# Patient Record
Sex: Male | Born: 1985 | Race: White | Hispanic: No | Marital: Single | Smoking: Former smoker
Health system: Southern US, Community
[De-identification: ages and names within clinical notes are randomized; demographics above are authoritative.]

## PROBLEM LIST (undated history)

## (undated) DIAGNOSIS — R748 Abnormal levels of other serum enzymes: Secondary | ICD-10-CM

## (undated) DIAGNOSIS — I10 Essential (primary) hypertension: Secondary | ICD-10-CM

## (undated) DIAGNOSIS — F4310 Post-traumatic stress disorder, unspecified: Secondary | ICD-10-CM

## (undated) DIAGNOSIS — F32A Depression, unspecified: Secondary | ICD-10-CM

## (undated) DIAGNOSIS — F1020 Alcohol dependence, uncomplicated: Secondary | ICD-10-CM

## (undated) DIAGNOSIS — K760 Fatty (change of) liver, not elsewhere classified: Secondary | ICD-10-CM

## (undated) DIAGNOSIS — F319 Bipolar disorder, unspecified: Secondary | ICD-10-CM

## (undated) DIAGNOSIS — E785 Hyperlipidemia, unspecified: Secondary | ICD-10-CM

## (undated) DIAGNOSIS — F419 Anxiety disorder, unspecified: Secondary | ICD-10-CM

## (undated) HISTORY — DX: Fatty (change of) liver, not elsewhere classified: K76.0

## (undated) HISTORY — DX: Hyperlipidemia, unspecified: E78.5

## (undated) HISTORY — DX: Anxiety disorder, unspecified: F41.9

## (undated) HISTORY — DX: Depression, unspecified: F32.A

## (undated) HISTORY — DX: Bipolar disorder, unspecified: F31.9

## (undated) HISTORY — DX: Essential (primary) hypertension: I10

## (undated) HISTORY — DX: Post-traumatic stress disorder, unspecified: F43.10

## (undated) HISTORY — DX: Abnormal levels of other serum enzymes: R74.8

## (undated) HISTORY — DX: Alcohol dependence, uncomplicated: F10.20

---

## 2003-03-19 ENCOUNTER — Emergency Department (HOSPITAL_COMMUNITY): Admission: EM | Admit: 2003-03-19 | Discharge: 2003-03-20 | Payer: Self-pay | Admitting: Emergency Medicine

## 2003-03-20 ENCOUNTER — Encounter: Payer: Self-pay | Admitting: Emergency Medicine

## 2005-10-24 ENCOUNTER — Encounter: Admission: RE | Admit: 2005-10-24 | Discharge: 2005-10-24 | Payer: Self-pay | Admitting: Family Medicine

## 2014-05-22 ENCOUNTER — Emergency Department (HOSPITAL_COMMUNITY)
Admission: EM | Admit: 2014-05-22 | Discharge: 2014-05-23 | Disposition: A | Discharge status: Home / Self Care | Payer: Self-pay | Attending: Emergency Medicine | Admitting: Emergency Medicine

## 2014-05-22 ENCOUNTER — Emergency Department (HOSPITAL_COMMUNITY): Payer: Self-pay

## 2014-05-22 ENCOUNTER — Encounter (HOSPITAL_COMMUNITY): Payer: Self-pay | Admitting: Emergency Medicine

## 2014-05-22 DIAGNOSIS — R1013 Epigastric pain: Secondary | ICD-10-CM | POA: Insufficient documentation

## 2014-05-22 DIAGNOSIS — Z720 Tobacco use: Secondary | ICD-10-CM | POA: Insufficient documentation

## 2014-05-22 LAB — COMPREHENSIVE METABOLIC PANEL
ALT: 77 U/L — ABNORMAL HIGH (ref 0–53)
AST: 47 U/L — ABNORMAL HIGH (ref 0–37)
Albumin: 4.2 g/dL (ref 3.5–5.2)
Alkaline Phosphatase: 94 U/L (ref 39–117)
Anion gap: 12 (ref 5–15)
BUN: 15 mg/dL (ref 6–23)
CALCIUM: 9.9 mg/dL (ref 8.4–10.5)
CHLORIDE: 99 meq/L (ref 96–112)
CO2: 23 mEq/L (ref 19–32)
CREATININE: 1.01 mg/dL (ref 0.50–1.35)
GFR calc Af Amer: >90 mL/min (ref >90)
GLUCOSE: 99 mg/dL (ref 70–99)
Potassium: 3.9 mEq/L (ref 3.7–5.3)
SODIUM: 134 meq/L — AB (ref 137–147)
Total Bilirubin: 0.3 mg/dL (ref 0.3–1.2)
Total Protein: 7.9 g/dL (ref 6.0–8.3)

## 2014-05-22 LAB — CBC WITH DIFFERENTIAL/PLATELET
BASOS ABS: 0 10*3/uL (ref 0.0–0.1)
BASOS PCT: 0 % (ref 0–1)
EOS ABS: 0.1 10*3/uL (ref 0.0–0.7)
Eosinophils Relative: 1 % (ref 0–5)
HEMATOCRIT: 46.9 % (ref 39.0–52.0)
HEMOGLOBIN: 16.4 g/dL (ref 13.0–17.0)
LYMPHS PCT: 21 % (ref 12–46)
Lymphs Abs: 2.2 10*3/uL (ref 0.7–4.0)
MCH: 31.8 pg (ref 26.0–34.0)
MCHC: 35 g/dL (ref 30.0–36.0)
MCV: 90.9 fL (ref 78.0–100.0)
Monocytes Absolute: 1 10*3/uL (ref 0.1–1.0)
Monocytes Relative: 10 % (ref 3–12)
NEUTROS ABS: 6.8 10*3/uL (ref 1.7–7.7)
Neutrophils Relative %: 68 % (ref 43–77)
Platelets: 204 10*3/uL (ref 150–400)
RBC: 5.16 MIL/uL (ref 4.22–5.81)
RDW: 13.3 % (ref 11.5–15.5)
WBC: 10.2 10*3/uL (ref 4.0–10.5)

## 2014-05-22 LAB — URINALYSIS, ROUTINE W REFLEX MICROSCOPIC
Bilirubin Urine: NEGATIVE
Glucose, UA: NEGATIVE mg/dL
HGB URINE DIPSTICK: NEGATIVE
Ketones, ur: NEGATIVE mg/dL
Leukocytes, UA: NEGATIVE
NITRITE: NEGATIVE
PH: 6.5 (ref 5.0–8.0)
PROTEIN: NEGATIVE mg/dL
SPECIFIC GRAVITY, URINE: 1.012 (ref 1.005–1.030)
Urobilinogen, UA: 1 mg/dL (ref 0.0–1.0)

## 2014-05-22 LAB — LIPASE, BLOOD: LIPASE: 43 U/L (ref 11–59)

## 2014-05-22 MED ORDER — PANTOPRAZOLE SODIUM 40 MG IV SOLR
40.0000 mg | Freq: Once | INTRAVENOUS | Status: AC
  Administered 2014-05-22: 40 mg via INTRAVENOUS
  Filled 2014-05-22: qty 40

## 2014-05-22 MED ORDER — GI COCKTAIL ~~LOC~~
30.0000 mL | Freq: Once | ORAL | Status: AC
  Administered 2014-05-22: 30 mL via ORAL
  Filled 2014-05-22: qty 30

## 2014-05-22 NOTE — ED Notes (Signed)
Pt complains of epigastric abd pain for 5 days, he states that it burns and cramps. No vomiting or diarrhea, but states that he has frequent painful urination.

## 2014-05-22 NOTE — ED Notes (Signed)
Bed: [facility]PT4 Expected date:  Expected time:  Means of arrival:  Comments: EMS 28 yo male ambulatory with abdominal pain

## 2014-05-22 NOTE — ED Provider Notes (Signed)
CSN: [id number]     Arrival date & time 05/22/14  2142 History   First MD Initiated Contact with Patient 05/22/14 2248     Chief Complaint  Patient presents with  . Abdominal Pain     (Consider location/radiation/quality/duration/timing/severity/associated sxs/prior Treatment) HPI Comments: Patient presents today with a chief complaint of epigastric abdominal pain.  He reports that the pain has been occurring intermittently over the past week.  He states that the pain is brought on by eating.  He describes the pain as a burning pain and reports that it radiates into his chest.  He has not taken anything for the pain.  He denies nausea, vomiting, diarrhea, or constipation.  He reports a history of Alcoholism.  He states that he had been drinking heavily up until one week ago.  He denies hematemesis.  No history of esophageal varices, GERD, or PUD.  No history of Gallstones.  He also reports mild SOB and cough.  No chest pain at this time.  Denies fever or chills.    Patient is a 28 y.o. male presenting with abdominal pain. The history is provided by the patient.  Abdominal Pain   History reviewed. No pertinent past medical history. History reviewed. No pertinent past surgical history. History reviewed. No pertinent family history. History  Substance Use Topics  . Smoking status: Current Every Day Smoker  . Smokeless tobacco: Not on file  . Alcohol Use: Yes    Review of Systems  Gastrointestinal: Positive for abdominal pain.  All other systems reviewed and are negative.     Allergies  Review of patient's allergies indicates no known allergies.  Home Medications   Prior to Admission medications   Medication Sig Start Date End Date Taking? Authorizing Provider  Aspirin-Salicylamide-Caffeine (BC HEADACHE POWDER PO) Take 1 Package by mouth daily as needed (for pain).   Yes Historical Provider, MD  bismuth subsalicylate (PEPTO BISMOL) 262 MG/15ML suspension Take 15 mLs by mouth  every 6 (six) hours as needed (stomach pain).   Yes Historical Provider, MD   BP 134/88  Pulse 77  Temp(Src) 98.2 F (36.8 C) (Oral)  Resp 14  SpO2 100% Physical Exam  Nursing note and vitals reviewed. Constitutional: He appears well-developed and well-nourished.  HENT:  Head: Normocephalic and atraumatic.  Mouth/Throat: Oropharynx is clear and moist.  Neck: Normal range of motion. Neck supple.  Cardiovascular: Normal rate, regular rhythm and normal heart sounds.   Pulmonary/Chest: Effort normal and breath sounds normal.  Abdominal: Soft. Bowel sounds are normal. He exhibits no distension and no mass. There is no tenderness. There is no rebound, no guarding and negative Murphy's sign.  Musculoskeletal: Normal range of motion.  Neurological: He is alert.  Skin: Skin is warm and dry.  Psychiatric: He has a normal mood and affect.    ED Course  Procedures (including critical care time) Labs Review Labs Reviewed  COMPREHENSIVE METABOLIC PANEL - Abnormal; Notable for the following:    Sodium 134 (*)    AST 47 (*)    ALT 77 (*)    All other components within normal limits  CBC WITH DIFFERENTIAL  LIPASE, BLOOD  URINALYSIS, ROUTINE W REFLEX MICROSCOPIC    Imaging Review Dg Chest 2 View  05/23/2014   CLINICAL DATA:  Initial encounter for chest pain, burning, and cramping for 5 days. Shortness of breath.  EXAM: CHEST  2 VIEW  COMPARISON:  Two-view chest 10/24/2005.  FINDINGS: The heart size and mediastinal contours are within normal limits.  Both lungs are clear. The visualized skeletal structures are unremarkable.  IMPRESSION: Negative two view chest.   Electronically Signed   By: Gennette Pac M.D.   On: 05/23/2014 01:44     EKG Interpretation None     1:19 AM Reassessed patient.  He reports that his pain has improved at this time.  Abdomen soft and non tender. MDM   Final diagnoses:  None   Patient presenting with an intermittent burning epigastric pain radiating to his  chest that occurs after eating.  Patient afebrile.  Labs unremarkable.  Symptoms improved after given GI cocktail and Protonix.  Abdomen is non tender on exam.  Therefore, do not feel that any abdominal imaging is indicated at this time.  Patient stable for discharge.  Patient instructed to take PPI.  Given referral to GI is symptoms persist.  Stable for discharge.  Return precautions given.    Santiago Glad, PA-C 05/24/14 [phone]

## 2014-05-23 MED ORDER — OMEPRAZOLE 20 MG PO CPDR: 20.0000 mg | DELAYED_RELEASE_CAPSULE | Freq: Every day | ORAL | Status: DC

## 2014-05-25 NOTE — ED Provider Notes (Signed)
Medical screening examination/treatment/procedure(s) were performed by non-physician practitioner and as supervising physician I was immediately available for consultation/collaboration.   EKG Interpretation None        Layla MawKristen N Ward, DO 05/25/14 [phone]

## 2021-03-15 ENCOUNTER — Ambulatory Visit (HOSPITAL_COMMUNITY)
Admission: EM | Admit: 2021-03-15 | Discharge: 2021-03-15 | Disposition: A | Discharge status: Home / Self Care | Payer: BC Managed Care – PPO | Attending: Psychiatry | Admitting: Psychiatry

## 2021-03-15 ENCOUNTER — Other Ambulatory Visit: Payer: Self-pay

## 2021-03-15 DIAGNOSIS — F419 Anxiety disorder, unspecified: Secondary | ICD-10-CM | POA: Diagnosis not present

## 2021-03-15 DIAGNOSIS — F4323 Adjustment disorder with mixed anxiety and depressed mood: Secondary | ICD-10-CM | POA: Diagnosis not present

## 2021-03-15 NOTE — BH Assessment (Signed)
Comprehensive Clinical Assessment (CCA) Note  03/15/2021 Francisco Edwards [id number]  Patient is a 35 year old male presenting voluntarily to [facility] for evaluation of increased depression and passive SI. Patient states he has become increasingly depressed since his mother passed away in Dec 24, 2019. He reported his suicidal ideation to his employer who recommended he come here for evaluation. Patient endorses thoughts of "I just don't want to be here anymore." He denies any intent or plan. He states, "Im involved in the church and I know where I'll go if I do that." Patient denies HI/AVH. Other than his mother's passing, he cites work and financial stresses as major triggers. He does report a history of alcohol abuse and states he was sober for 7 years until his mom passed. He states he does not drink excessively and reports drinking about 5 beers 3-4 days per week. He denies using any other substances. Patient states his employer's HR department got him linked with a therapist for 5 sessions but he feels he may need more than his. His PCP prescribes Valium and Welbutrin but he has not seen a psychiatrist.   Per Dr. Bronwen Betters, patient does not meet in patient care criteria and is psych cleared. He has been provided with additional resources and a referral to [facility] PHP.  Chief Complaint: depression  Visit Diagnosis: F33.2 MDD, recurrent, severe    CCA Screening, Triage and Referral (STR)  Patient Reported Information How did you hear about [place]? Self  What Is the Reason for Your Visit/Call Today? Patient presents at recommendation of his supervisor after informing him and other co workers he was having suicidal thoughts.  Patient lost his mother in 05-08-21and father now has cancer.  Patient with hx of anxiety and depression and prescribed wellbutrin and diazepine by PCP.  He states he isn't sure it's working.  SI with thoughts to drive his car into water.  Patient denies HI, AVH.  ETOH use increased to 5  beers per day since mother died. He would like to speak with a provider about medication options and would like referrals for psychiatry.  How Long Has This Been Causing You Problems? > than 6 months  What Do You Feel Would Help You the Most Today? Treatment for Depression or other mood problem   Have You Recently Had Any Thoughts About Hurting Yourself? Yes  Are You Planning to Commit Suicide/Harm Yourself At This time? No   Have you Recently Had Thoughts About Hurting Someone Francisco Edwards? No  Are You Planning to Harm Someone at This Time? No  Explanation: No data recorded  Have You Used Any Alcohol or Drugs in the Past 24 Hours? Yes  How Long Ago Did You Use Drugs or Alcohol? No data recorded What Did You Use and How Much? ETOH - daily use - 5 beers per day   Do You Currently Have a Therapist/Psychiatrist? Yes  Name of Therapist/Psychiatrist: patient's company has linked him with a therapist   Have You Been Recently Discharged From Any Office Practice or Programs? No  Explanation of Discharge From Practice/Program: No data recorded    CCA Screening Triage Referral Assessment Type of Contact: Face-to-Face  Telemedicine Service Delivery:   Is this Initial or Reassessment? No data recorded Date Telepsych consult ordered in [facility]:  No data recorded Time Telepsych consult ordered in [facility]:  No data recorded Location of Assessment: [facility] Assessment Services  Provider Location: [facility] Assessment Services   Collateral Involvement: NA  Does Patient Have a Automotive engineerCourt Appointed Legal Guardian? No data recorded Name and Contact of Legal Guardian: No data recorded If Minor and Not Living with Parent(s), Who has Custody? No data recorded Is CPS involved or ever been involved? Never  Is APS involved or ever been involved? Never   Patient Determined To Be At Risk for Harm To Self or Others Based on Review of Patient Reported Information or Presenting Complaint? No  Method: No data  recorded Availability of Means: No data recorded Intent: No data recorded Notification Required: No data recorded Additional Information for Danger to Others Potential: No data recorded Additional Comments for Danger to Others Potential: No data recorded Are There Guns or Other Weapons in Your Home? No data recorded Types of Guns/Weapons: No data recorded Are These Weapons Safely Secured?                            No data recorded Who Could Verify You Are Able To Have These Secured: No data recorded Do You Have any Outstanding Charges, Pending Court Dates, Parole/Probation? No data recorded Contacted To Inform of Risk of Harm To Self or Others: No data recorded   Does Patient Present under Involuntary Commitment? No  IVC Papers Initial File Date: No data recorded  [place]   Patient Currently Receiving the Following Services: Individual Therapy   Determination of Need: Urgent (48 hours)   Options For Referral: [facility]; Intensive Outpatient Therapy; Medication Management; Outpatient Therapy     CCA Biopsychosocial Patient Reported Schizophrenia/Schizoaffective Diagnosis in Past: No   Strengths: motivated for treatment   Mental Health Symptoms Depression:   Change in energy/activity; Difficulty Concentrating; Fatigue; Hopelessness; Weight gain/loss; Tearfulness; Sleep (too much or little); Irritability; Increase/decrease in appetite; Worthlessness   Duration of Depressive symptoms:  Duration of Depressive Symptoms: Greater than two weeks   Mania:   None   Anxiety:    Difficulty concentrating; Fatigue; Irritability; Restlessness; Sleep; Tension; Worrying   Psychosis:   None   Duration of Psychotic symptoms:    Trauma:   None   Obsessions:   None   Compulsions:   None   Inattention:   None   Hyperactivity/Impulsivity:   None   Oppositional/Defiant Behaviors:   None   Emotional Irregularity:    None   Other Mood/Personality Symptoms:  No data recorded   Mental Status Exam Appearance and self-care  Stature:   Average   Weight:   Average weight   Clothing:   Neat/clean   Grooming:   Normal   Cosmetic use:   None   Posture/gait:   Normal   Motor activity:   Not Remarkable   Sensorium  Attention:   Normal   Concentration:   Normal   Orientation:   X5   Recall/memory:   Normal   Affect and Mood  Affect:   Appropriate; Depressed   Mood:   Depressed   Relating  Eye contact:   Normal   Facial expression:   Responsive   Attitude toward examiner:   Cooperative   Thought and Language  Speech flow:  Clear and Coherent   Thought content:   Appropriate to Mood and Circumstances   Preoccupation:   None   Hallucinations:   None   Organization:  No data recorded  Affiliated Computer ServicesExecutive Functions  Fund of Knowledge:   Good   Intelligence:   Average   Abstraction:   Normal  Judgement:   Fair   Dance movement psychotherapist:   Realistic   Insight:   Fair   Decision Making:   Normal   Social Functioning  Social Maturity:   Isolates   Social Judgement:   Normal   Stress  Stressors:   Grief/losses; Work; Office manager Ability:   Human resources officer Deficits:   None   Supports:   Friends/Service system; Family     Religion: Religion/Spirituality Are You A Religious Person?: Yes What is Your Religious Affiliation?: Christian How Might This Affect Treatment?: statesw, "If I kill myself I know where I'll go."  Leisure/Recreation: Leisure / Recreation Do You Have Hobbies?: Yes Leisure and Hobbies: music  Exercise/Diet: Exercise/Diet Do You Exercise?: No Have You Gained or Lost A Significant Amount of Weight in the Past Six Months?: Yes-Lost Number of Pounds Lost?: 30 Do You Follow a Special Diet?: No Do You Have Any Trouble Sleeping?: Yes   CCA Employment/Education Employment/Work Situation: Employment / Work  Situation Employment Situation: Employed Work Stressors: states he does not get a long with one employee Patient's Job has Been Impacted by Current Illness: No Has Patient ever Been in the [employer]?: No  Education: Education Is Patient Currently Attending School?: No Last Grade Completed: 12 Did You Product manager?: No Did You Have An Individualized Education Program (IIEP): No Did You Have Any Difficulty At [employer]?: No Patient's Education Has Been Impacted by Current Illness: No   CCA Family/Childhood History Family and Relationship History: Family history Marital status: Single Does patient have children?: No  Childhood History:  Childhood History By whom was/is the patient raised?: Both parents Did patient suffer any verbal/emotional/physical/sexual abuse as a child?: No Did patient suffer from severe childhood neglect?: No Has patient ever been sexually abused/assaulted/raped as an adolescent or adult?: No Was the patient ever a victim of a crime or a disaster?: No Witnessed domestic violence?: No Has patient been affected by domestic violence as an adult?: No  Child/Adolescent Assessment:     CCA Substance Use Alcohol/Drug Use: Alcohol / Drug Use Pain Medications: see MAR Prescriptions: see MAR Over the Counter: see MAR History of alcohol / drug use?: Yes Longest period of sobriety (when/how long): 7 years Substance #1 Name of Substance 1: alcohol 1 - Age of First Use: UTA 1 - Amount (size/oz): 5 beers 1 - Frequency: 3-4 times weekly 1 - Duration: 1.5 years 1 - Last Use / Amount: 7/28- 5 beers 1 - Method of Aquiring: purchased 1- Route of Use: drank                       ASAM's:  Six Dimensions of Multidimensional Assessment  Dimension 1:  Acute Intoxication and/or Withdrawal Potential:   Dimension 1:  Description of individual's past and current experiences of substance use and withdrawal: patient not currently in active addiction  Dimension  2:  Biomedical Conditions and Complications:      Dimension 3:  Emotional, Behavioral, or Cognitive Conditions and Complications:     Dimension 4:  Readiness to Change:     Dimension 5:  Relapse, Continued use, or Continued Problem Potential:     Dimension 6:  Recovery/Living Environment:     ASAM Severity Score:    ASAM Recommended Level of Treatment:     Substance use Disorder (SUD)    Recommendations for Services/Supports/Treatments:    Discharge Disposition:    DSM5 Diagnoses: There are no problems to display for this patient.  Referrals to Alternative Service(s): Referred to Alternative Service(s):   Place:   Date:   Time:    Referred to Alternative Service(s):   Place:   Date:   Time:    Referred to Alternative Service(s):   Place:   Date:   Time:    Referred to Alternative Service(s):   Place:   Date:   Time:     Celedonio Miyamoto, LCSW

## 2021-03-15 NOTE — ED Provider Notes (Signed)
Behavioral Health Urgent Care Medical Screening Exam  Patient Name: Francisco Edwards MRN: [id number] Date of Evaluation: 03/15/21 Chief Complaint:   Diagnosis:  Final diagnoses:  Adjustment disorder with mixed anxiety and depressed mood  Anxiety    History of Present illness: Francisco Edwards is a 35 y.o. male with a reported psychiatric history of anxiety and PTSD who presents to the [facility] for evaluation of SI in the setting of numerous stressors; patient recommended to present for evaluation by his supervisor. Pt is interviewed in conjunction with TTS. Pt states that he presented to the [facility] today because "my work is concerned about me". He reports that he has been experiencing passive SI since his motoher passed away in April 2021-states that she passed away suddenly from a stroke. He states that he was the one who had to make the decision to take his mother off of life support and reports that one of his doctors told him he had PTSD from this. Pt says that he recently has gotten back into contact with his father and he goes to his house to cut the grass every weekend- but  pt reports that his father  has also been recently diagnosed with throat cancer. He states that he is still dealing with his mother's estate which has been a source of stress for him and describes financial difficulties. Additional stressors include conflict at this work place -says that one coworker had been bullying him-he states that this was reported to his supervisor and that this individual was reprimanded. He reports some passive SI but denies plan or intent. He cites his family, friends and faith as protective factors and states that "I know where I would go", pointing to the ground indicating that he would go to hell if he committed suicide. He contracts for safety and is agreeable to return to the ED if he has SI or feels unsafe. He denies HI/AVH.  He reports depressed mood and associated symptoms of sleep disturbances,  decreased appetite, and decreased energy. Pt reports that his coworkers are supportive, in particular his supervisor who recommended that he present for evaluation. He denies previous psychiatric hospitalizations or SA. He currently sees a PCP who prescribes him wellbutrin and valium. He states that he used to drink heavily but was sober for 7 years until his mother passed-he reports drinking 5 beers a night ~3 days out of the week on average. Denies alcohol withdrawal sx when he does not drink. 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  Patient contracts for safety and expresses interest in outpatient service. Discussed IOP. TTS counselor made referral.   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 Status: alone with "support dog" Easy access to gun: reports that there is an old antique gun in his home that he wants to give to his father; describes gun as not functional.   Substance Use (with emphasis over the last 12 months) Recreational Drugs: denies Use of Alcohol:  as per HPI Rehab History: denies H/O Complicated Withdrawal: no  Legal History: Past Charges/Incarcerations: no Pending charges: no  Family Psychiatric History: Mother- dementia  Psychiatric Specialty Exam  Presentation  General Appearance:Appropriate for Environment; Casual  Eye Contact:Fair  Speech:Clear and Coherent; Normal Rate  Speech Volume:Normal  Handedness:No data recorded  Mood and Affect  Mood:Dysphoric  Affect:Appropriate; Congruent   Thought  Process  Thought Processes:Coherent; Goal Directed; Linear  Descriptions of Associations:Intact  Orientation:Full (Time, Place and Person)  Thought Content:WDL    Hallucinations:None  Ideas of Reference:None  Suicidal Thoughts:Yes, Passive Without Intent; Without Plan  Homicidal Thoughts:No   Sensorium  Memory:Immediate Good; Recent Good; Remote Good  Judgment:Fair  Insight:Fair   Executive Functions  Concentration:Good  Attention Span:Good  Recall:Good  Fund of Knowledge:Good  Language:Good   Psychomotor Activity  Psychomotor Activity:Normal   Assets  Assets:Communication Skills; Desire for Improvement; Resilience; Vocational/Educational   Sleep  Sleep:Poor  Number of hours:  No data recorded  No data recorded  Physical Exam: Physical Exam Constitutional:      Appearance: Normal appearance. He is normal weight.  HENT:     Head: Normocephalic and atraumatic.  Eyes:     Extraocular Movements: Extraocular movements intact.  Pulmonary:     Effort: Pulmonary effort is normal.  Neurological:     General: No focal deficit present.     Mental Status: He is alert and oriented to person, place, and time.  Psychiatric:        Attention and Perception: Attention and perception normal.        Speech: Speech normal.        Behavior: Behavior is cooperative.        Thought Content: Thought content normal.   Review of Systems  Constitutional: Negative.   Eyes: Negative.   Respiratory: Negative.    Cardiovascular: Negative.   Gastrointestinal: Negative.   Musculoskeletal: Negative.   Neurological: Negative.   Psychiatric/Behavioral:  Positive for depression.   Blood pressure 110/89, pulse 72, temperature 97.8 F (36.6 C), temperature source Oral, resp. rate 16, SpO2 99 %. There is no height or weight on file to calculate BMI.  Musculoskeletal: Strength & Muscle Tone: within normal limits Gait & Station: normal Patient leans: N/A   [facility] MSE  Discharge Disposition for Follow up and Recommendations: Based on my evaluation the patient does not appear to have an emergency medical condition and can be discharged with resources and follow up care in outpatient services for Partial Hospitalization Program  TTS counselor made referral to IOP program  Patient is instructed prior to discharge to: Take all medications as prescribed by his/her mental healthcare provider. Report any adverse effects and or reactions from the medicines to his/her outpatient provider promptly. Patient has been instructed & cautioned: To not engage in alcohol and or illegal drug use while on prescription medicines. In the event of worsening symptoms, patient is instructed to call the crisis hotline, 911 and or go to the nearest ED for appropriate evaluation and treatment of symptoms. To follow-up with his/her primary care provider for your other medical issues, concerns and or health care needs.      Estella Husk, MD 03/15/2021, 11:53 AM

## 2021-03-15 NOTE — Progress Notes (Signed)
   03/15/21 1101  [facility] Triage Screening (Walk-ins at [facility] only)  How Did You Hear About [place]? Self  What Is the Reason for Your Visit/Call Today? Patient presents at recommendation of his supervisor after informing him and other co workers he was having suicidal thoughts.  Patient lost his mother in April 2021 and father now has cancer.  Patient with hx of anxiety and depression and prescribed wellbutrin and diazepine by PCP.  He states he isn't sure it's working.  SI with thoughts to drive his car into water.  Patient denies HI, AVH.  ETOH use increased to 5 beers per day since mother died. He would like to speak with a provider about medication options and would like referrals for psychiatry.  How Long Has This Been Causing You Problems? > than 6 months  Have You Recently Had Any Thoughts About Hurting Yourself? Yes  How long ago did you have thoughts about hurting yourself? SI today - SV referred him here  Have you Recently Had Thoughts About Hurting Someone Karolee Ohs? No  Are You Planning To Harm Someone At This Time? No  Are you currently experiencing any auditory, visual or other hallucinations? No  Have You Used Any Alcohol or Drugs in the Past 24 Hours? Yes  How long ago did you use Drugs or Alcohol? yesterday  What Did You Use and How Much? ETOH - daily use - 5 beers per day  Do you have any current medical co-morbidities that require immediate attention? No  Clinician description of patient physical appearance/behavior: Tearful, flat affect  What Do You Feel Would Help You the Most Today? Treatment for Depression or other mood problem  If access to [facility] was not available, would you have sought care in the Emergency Department? Yes  Determination of Need Urgent (48 hours)  Options For Referral Inpatient Hospitalization;BH Urgent Care;Intensive Outpatient Therapy;Medication Management;Outpatient Therapy

## 2021-03-15 NOTE — Discharge Summary (Signed)
Francisco Edwards to be D/C'd Home per MD order. Discussed with the patient and all questions fully answered. An After Visit Summary was printed and given to the patient. Patient escorted out and D/C home via private auto.  Francisco Edwards  03/15/2021 12:31 PM

## 2021-03-18 ENCOUNTER — Telehealth (HOSPITAL_COMMUNITY): Payer: Self-pay | Admitting: Licensed Clinical Social Worker

## 2021-03-25 ENCOUNTER — Other Ambulatory Visit: Payer: Self-pay

## 2021-03-25 ENCOUNTER — Other Ambulatory Visit (HOSPITAL_COMMUNITY): Payer: BC Managed Care – PPO | Attending: Psychiatry

## 2021-03-25 DIAGNOSIS — F419 Anxiety disorder, unspecified: Secondary | ICD-10-CM | POA: Insufficient documentation

## 2021-03-25 DIAGNOSIS — F332 Major depressive disorder, recurrent severe without psychotic features: Secondary | ICD-10-CM | POA: Insufficient documentation

## 2021-03-26 ENCOUNTER — Other Ambulatory Visit (HOSPITAL_COMMUNITY): Payer: BC Managed Care – PPO

## 2021-03-26 ENCOUNTER — Telehealth (HOSPITAL_COMMUNITY): Payer: Self-pay | Admitting: Licensed Clinical Social Worker

## 2021-03-26 ENCOUNTER — Other Ambulatory Visit: Payer: Self-pay

## 2021-03-27 ENCOUNTER — Encounter (HOSPITAL_COMMUNITY): Payer: Self-pay

## 2021-03-27 ENCOUNTER — Other Ambulatory Visit (HOSPITAL_COMMUNITY): Payer: BC Managed Care – PPO | Admitting: Occupational Therapy

## 2021-03-27 ENCOUNTER — Other Ambulatory Visit (HOSPITAL_COMMUNITY): Payer: BC Managed Care – PPO | Admitting: Licensed Clinical Social Worker

## 2021-03-27 ENCOUNTER — Other Ambulatory Visit: Payer: Self-pay

## 2021-03-27 DIAGNOSIS — F332 Major depressive disorder, recurrent severe without psychotic features: Secondary | ICD-10-CM

## 2021-03-27 DIAGNOSIS — R41844 Frontal lobe and executive function deficit: Secondary | ICD-10-CM

## 2021-03-27 DIAGNOSIS — R4589 Other symptoms and signs involving emotional state: Secondary | ICD-10-CM

## 2021-03-27 DIAGNOSIS — F419 Anxiety disorder, unspecified: Secondary | ICD-10-CM | POA: Diagnosis not present

## 2021-03-27 MED ORDER — ARIPIPRAZOLE 5 MG PO TABS: 5.0000 mg | ORAL_TABLET | Freq: Every day | ORAL | 0 refills | Status: DC

## 2021-03-27 NOTE — Therapy (Signed)
[facility] PARTIAL HOSPITALIZATION PROGRAM [address] [place], [place], [address] Phone: [phone]   Fax:  [phone] Virtual Visit via Video Note  I connected with Francisco Edwards on 03/27/21 at  11:00 AM EDT by a video enabled telemedicine application and verified that I am speaking with the correct person using two identifiers.  Location: Patient: Patient Home Provider: Clinic Office   I discussed the limitations of evaluation and management by telemedicine and the availability of in person appointments. The patient expressed understanding and agreed to proceed.   I discussed the assessment and treatment plan with the patient. The patient was provided an opportunity to ask questions and all were answered. The patient agreed with the plan and demonstrated an understanding of the instructions.   The patient was advised to call back or seek an in-person evaluation if the symptoms worsen or if the condition fails to improve as anticipated.  I provided 83 minutes of non-face-to-face time during this encounter. 60 minutes OT Group 23 minutes OT Evaluation  Donne Hazel, OT   Occupational Therapy Evaluation  Patient Details  Name: Francisco Edwards MRN: [id number] Date of Birth: 1986/04/07 Referring Provider (OT): Hillery Jacks   Encounter Date: 03/27/2021   OT End of Session - 03/27/21 1419     Visit Number 1    Number of Visits 20    Date for OT Re-Evaluation 04/24/21    Authorization Type BCBS    OT Start Time 1200   OT Eval [id number]   OT Stop Time 1250    OT Time Calculation (min) 50 min    Activity Tolerance Patient tolerated treatment well    Behavior During Therapy Anxious             History reviewed. No pertinent past medical history.  History reviewed. No pertinent surgical history.  There were no vitals filed for this visit.   Subjective Assessment - 03/27/21 1417     Currently in Pain? No/denies                [facility] OT Assessment - 03/27/21 0001       Assessment   Medical Diagnosis Major depressive disorder    Referring Provider (OT) Hillery Jacks      Precautions   Precautions None      Balance Screen   Has the patient fallen in the past 6 months No    Has the patient had a decrease in activity level because of a fear of falling?  No    Is the patient reluctant to leave their home because of a fear of falling?  No             OT Education - 03/27/21 1417     Education Details Educated on OT role within PHP in addition to sleep hygiene and strategies to improve overall sleep quality    Person(s) Educated Patient    Methods Explanation;Handout    Comprehension Verbalized understanding              OT Short Term Goals - 03/27/21 1423       OT SHORT TERM GOAL #1   Title Pt will actively engage in OT group sessions throughout duration of PHP programming, in order to promote daily structure, social engagement, and opportunities to develop and utilize adaptive strategies to maximize functional performance in preparation for safe transition and integration back into school, work, and the community.    Time 4  Period Weeks    Status New    Target Date 04/24/21      OT SHORT TERM GOAL #2   Title Pt will identify and implement 1-3 sleep hygiene strategies he can utilize, in order to improve sleep quality/ADL performance, in preparation for safe and healthy reintegration back into the community at discharge.    Time 4    Period Weeks    Status New    Target Date 04/24/21      OT SHORT TERM GOAL #3   Title Pt will practice and identify 1-3 adaptive coping strategies she can utilize, in order to safely manage increased depression/anxiety, without engaging in self-harm behaviors, with min cues, in preparation for safe and healthy reintegration back into the community at discharge.    Time 4    Period Weeks    Status New    Target Date 04/24/21      OT SHORT TERM GOAL #4   Title  Pt will demonstrate an increase in assertive communication skills, as evidenced by, identifying at least 2 "I" statements when sharing their feelings/needs, with min cues, in preparation for reintegration back into the community at discharge.    Time 4    Period Weeks    Status New    Target Date 04/24/21           Occupational Therapy Assessment 03/27/2021  Francisco "Gala Romney" is a 35 y/o male with PMHx of anxiety, depression, PTSD, and passive SI who was referred to the [facility] program from the [facility] with reports of worsening depression, anxiety, and suicidal ideation in the context of ongoing stressors including the recent loss of his mother in April 2021. Pt lives alone with his support dog, a black lab named Buddy. 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 Pt reports severe social anxiety, dislikes being out in public and in group/social settings. Pt also reports difficulty engaging in ADL/iADLs including showering, eating regularly, and sleep. Pt reports a desire to engage in PHP programming in order to manage identified stressors and to engage meaningfully in identified areas of occupation and ADL/iADLs. Upon approach, pt presents as highly anxious, hyper verbal, tangential, and difficult to engage at times throughout OT Evaluation. Pt reports having severe social anxiety and was presently noted during session. Pt reports enjoying playing video games, building PCs, and going to car meets and identifies goal for admission "work through all this stuff I got going on".   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    Leisure Horticulturist, commercial: Enjoys going to  car meets, playing video games, building [employer], and attending church  Social Support: Seeks support from friends   What do you do when you are very stressed, angry, upset, sad or anxious? Hurt myself (self-harm), Use drugs/alcohol, Yell/Scream, Cry, and Listen to music   What helps when you are not feeling well? Playing a game, Listening to music, and Sleep  What are some things that make it MORE difficult for you when you are already upset? Not being able to express my opinion, People staring at me, Being criticized, and Boredom/Lack of activities  Is there anything  specific that you would like help with while you're in the partial hospitalization program? Coping Skills, Anger Management, Impulse Control, Stress Management, Self-Harm Urges, Self-Care, Sleep, and Self-esteem   What is your goal while you are here?  Work through what I've got going on  Assessment: Pt demonstrates behavior that inhibits/restricts participation in occupation and would benefit from skilled occupational therapy services to address current difficulties with symptom management, emotion regulation, socialization, stress management, time management, job readiness, financial wellness, health and nutrition, sleep hygiene, ADL/iADL performance and leisure participation, in preparation for reintegration and return to community at discharge.   Plan: Pt will participate in skilled occupational therapy sessions (group and/or individual) in order to promote daily structure, social engagement, and opportunities to develop and utilize adaptive strategies to maximize functional performance in preparation for safe transition and integration back into school, work, and/or the community at discharge. OT sessions will occur 4-5 x per week for 2-4 weeks.   Donne Hazelassidy Ethin Drummond, MOT, OTR/L  Group Session:  S: "I have trouble sleeping at night and so then I sleep during the day and start the cycle of not being able to sleep at  night again."  O: Today's group discussion focused on topic of Sleep Hygiene. Patients reflected on the quality of sleep they typically receive and identified areas that need improvement. Group was given background information on sleep and sleep hygiene, including common sleep disorders. Group members also received information on how to improve one's sleep and introduced a sleep diary as a tool that can be utilized to track sleep quality over a length of time. Group session ended with patients identifying one or more strategies they could utilize or implement into their sleep routine in order to improve overall sleep quality.    A: Gala RomneyDoug was moderately engaged in today's session, however continued to present as highly anxious with the group setting, as noted with his camera being turned off and limited verbal engagement. Pt was able to report that he was feeling 'better' than this morning during OT evaluation, however still had anxiety about group setting and virtual dynamic. Pt shared that he struggles with falling asleep at night and this often precipitates a cycle in which he sleeps during the day and then continues the impact of sleeping at night. Overall, appeared receptive and open to education and strategies reviewed to improve overall sleep hygiene.   P: Continue to attend PHP OT group sessions 5x week for 4 weeks to promote daily structure, social engagement, and opportunities to develop and utilize adaptive strategies to maximize functional performance in preparation for safe transition and integration back into school, work, and the community. Plan to address topic of communication in next OT group session.  Plan - 03/27/21 1422     Clinical Impression Statement Riley LamDouglas "Gala RomneyDoug" is a 35 y/o male with PMHx of anxiety, depression, PTSD, and passive SI [facility] with reports of worsening depression, anxiety, and suicidal ideation in the context of ongoing  stressors including the recent loss of his mother in April 2021. Pt lives alone with his support dog, a black lab named Buddy. 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 Pt reports severe social anxiety, dislikes being out in public and in group/social settings. Pt also reports difficulty engaging in ADL/iADLs including showering, eating regularly, and sleep. Pt reports a desire to engage in PHP programming in order to manage identified stressors and to engage meaningfully in  identified areas of occupation and ADL/iADLs.    OT Occupational Profile and History Problem Focused Assessment - Including review of records relating to presenting problem    Occupational performance deficits (Please refer to evaluation for details): ADL's;IADL's;Rest and Sleep;Education;Work;Leisure;Social Participation    Body Structure / Function / Physical Skills ADL    Cognitive Skills Attention;Emotional;Energy/Drive;Learn;Memory;Perception;Problem Solve;Safety Awareness;Temperament/Personality;Thought;Understand    Psychosocial Skills Coping Strategies;Environmental  Adaptations;Habits;Interpersonal Interaction;Routines and Behaviors    Rehab Potential Good    Clinical Decision Making Limited treatment options, no task modification necessary    Comorbidities Affecting Occupational Performance: May have comorbidities impacting occupational performance    Modification or Assistance to Complete Evaluation  No modification of tasks or assist necessary to complete eval    OT Frequency 5x / week    OT Duration 4 weeks    OT Treatment/Interventions Self-care/ADL training;Patient/family education;Coping strategies training;Psychosocial skills training    Consulted and Agree with Plan of Care Patient             Patient will benefit from skilled therapeutic intervention in order to improve the following deficits and impairments:   Body Structure / Function / Physical Skills: ADL Cognitive Skills: Attention,  Emotional, Energy/Drive, Learn, Memory, Perception, Problem Solve, Safety Awareness, Temperament/Personality, Thought, Understand Psychosocial Skills: Coping Strategies, Environmental  Adaptations, Habits, Interpersonal Interaction, Routines and Behaviors   Visit Diagnosis: Difficulty coping  Frontal lobe and executive function deficit  Severe episode of recurrent major depressive disorder, without psychotic features (HCC)    Problem List There are no problems to display for this patient.   03/27/2021  Donne Hazel, MOT, OTR/L  03/27/2021, 2:25 PM  [facility] HOSPITALIZATION PROGRAM [address] [place], [place], [address] Phone: [phone]   Fax:  [phone]  Name: FENTON CANDEE MRN: [id number] Date of Birth: November 16, 1985

## 2021-03-27 NOTE — Progress Notes (Signed)
Virtual Visit via Video Note  I connected with Francisco Edwards on 03/27/21 at  9:00 AM EDT by a video enabled telemedicine application and verified that I am speaking with the correct person using two identifiers.  Location: Patient: Home Provider: Home Office    I discussed the limitations of evaluation and management by telemedicine and the availability of in person appointments. The patient expressed understanding and agreed to proceed.      I discussed the assessment and treatment plan with the patient. The patient was provided an opportunity to ask questions and all were answered. The patient agreed with the plan and demonstrated an understanding of the instructions.   The patient was advised to call back or seek an in-person evaluation if the symptoms worsen or if the condition fails to improve as anticipated.  I provided 15 minutes of non-face-to-face time during this encounter.   Oneta Rack, NP     Behavioral Health Partial Program Assessment Note  Date: 03/27/2021 Name: Francisco Edwards MRN: [id number]    HPI: Francisco Edwards is a 35 y.o. Caucasian male presents with suicidal ideations, depression and anxiety.  Reported " I told my boss, I am got to shoot myself in the head."  Reports struggling with depression for a while.  Reported unresolved grief and loss related to the passing of his mother 7 years prior.  States he is currently employed by [employer] which he states has been stressful at times.  Reports drinking alcohol daily. "  Like 3 beers a day but I am not a alcoholic."  Reports he has attended alcohol Anonymous in the past and does not feel as if he needs of assistance with substance abuse currently.  reports he is currently prescribed Abilify 2 mg, Valium 5 mg and Wellbutrin 150 mg .  He reports taking and tolerating medications well.  Patient is requesting a refill with Abilify 2 mg which he reported he was recently diagnosed with Bipolar disorder. Discussed  increasing Abilify 2 mg to 5 mg. Patient was receptive to plan.  Patient validates information in below assessment." Patient was enrolled in partial psychiatric program on 03/27/21.  Per HPI-History of Present illness: "Francisco Edwards is a 35 y.o. male with a reported psychiatric history of anxiety and PTSD who presents to the [facility] for evaluation of SI in the setting of numerous stressors; patient recommended to present for evaluation by his supervisor. Pt is interviewed in conjunction with TTS. Pt states that he presented to the [facility] today because "my work is concerned about me". He reports that he has been experiencing passive SI since his motoher passed away in April 2021-states that she passed away suddenly from a stroke. He states that he was the one who had to make the decision to take his mother off of life support and reports that one of his doctors told him he had PTSD from this. Pt says that he recently has gotten back into contact with his father and he goes to his house to cut the grass every weekend- but  pt reports that his father  has also been recently diagnosed with throat cancer. He states that he is still dealing with his mother's estate which has been a source of stress for him and describes financial difficulties. Additional stressors include conflict at this work place -says that one coworker had been bullying him-he states that this was reported to his supervisor and that this individual was reprimanded. He reports some passive SI  but denies plan or intent. He cites his family, friends and faith as protective factors and states that "I know where I would go", pointing to the ground indicating that he would go to hell if he committed suicide. He contracts for safety and is agreeable to return to the ED if he has SI or feels unsafe. He denies HI/AVH.  He reports depressed mood and associated symptoms of sleep disturbances, decreased appetite, and decreased energy. Pt reports that his  coworkers are supportive, in particular his supervisor who recommended that he present for evaluation. He denies previous psychiatric hospitalizations or SA. He currently sees a PCP who prescribes him wellbutrin and valium. He states that he used to drink heavily but was sober for 7 years until his mother passed-he reports drinking 5 beers a night ~3 days out of the week on average. Denies alcohol withdrawal sx when he does not drink. 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  Onset of symptoms was gradual with gradually worsening course since that time. Psychosocial Stressors include the following: family, financial, and occupational.   I have reviewed the following documentation dated 03/28/2021: past psychiatric history, past medical history, and past social and family history  Complaints of Pain: nonear Past Psychiatric History:  Past psychiatric hospitalizations , Previous suicide attempts , and Drug/alcohol   Currently in treatment with  Abilify, Wellbutrin and Valium   Substance Abuse History: alcohol and benzodiazepines Use of Alcohol: heavy  and 1 out of 4 on CAGE  Use of Caffeine: denies use Use of over the counter:   No past surgical history on file.  No past medical history on file. Outpatient Encounter Medications as of 03/27/2021  Medication Sig   Aspirin-Salicylamide-Caffeine (BC HEADACHE POWDER PO) Take 1 Package by mouth daily as needed (for pain).   bismuth subsalicylate (PEPTO BISMOL) 262 MG/15ML suspension Take 15 mLs by mouth every 6 (six) hours as needed (stomach pain).   omeprazole (PRILOSEC) 20 MG capsule Take 1 capsule (20 mg total) by mouth daily.   No facility-administered encounter medications on file as of 03/27/2021.   No Known Allergies  Social History   Tobacco  Use   Smoking status: Every Day   Smokeless tobacco: Not on file  Substance Use Topics   Alcohol use: Yes   Functioning Relationships: good support system and gets along well with co-workers Education: Other  Other Pertinent History: None No family history on file.   Review of Systems Constitutional: negative  Objective:  There were no vitals filed for this visit.  Physical Exam:   Mental Status Exam: Appearance:  Well groomed Psychomotor::  Within Normal Limits Attention span and concentration: Normal Behavior: calm, cooperative, and adequate rapport can be established Speech:  normal volume Mood:  depressed and anxious Affect:  normal Thought Process:  Coherent Thought Content:  Logical Orientation:  person, place, and time/date Cognition:  grossly intact Insight:  Fair Judgment:  Fair Estimate of Intelligence: Average Fund of knowledge: Aware of current events Memory: Recent and remote intact Abnormal movements: None Gait and station: Normal  Assessment:  Diagnosis: Severe episode of recurrent major depressive disorder, without psychotic features (HCC) [F33.2] 1. Severe episode of recurrent major depressive disorder, without psychotic features (HCC)     Indications for admission: inpatient care required if not in partial  hospital program  Plan:  Orders placed for occupational therapy.  patient enrolled in Partial Hospitalization Program, patient's current medications are to be continued, the following medications are being prescribed Increased Abilify 2 mg to 5 mg daily, a comprehensive treatment plan will be developed, and side effects of medications have been reviewed with patient  Treatment options and alternatives reviewed with patient and patient understands the above plan. Treatment plan was reviewed and agreed upon by NP T.Melvyn Neth and Patient Francisco Edwards need for group services.     Oneta Rack, NP

## 2021-03-27 NOTE — Progress Notes (Signed)
Pt attended spiritual care group 11:00-12:00. Group met via web-ex due to COVID-19 precautions. Group facilitated by Kathrynn Humble, [place]  Group focused on topic of "community." Members reflected on topic in facilitated dialog, identifying responses to topic and notions they hold of community from their previous experience. Group members utilized value sort cards to identify top qualities they look for in community. Engaged in facilitated dialog around their value choices, noting origin of these values, how these are realized in their lives, and strategies for engaging these values.  Spiritual care group drew on Motivational Interviewing, Narrative and Adlerian modalities  Patient Progress:  Patient participated and engaged in conversation when they were able.  They were not able to turn on their camera and spent some time just caring for self during the session.  [place], Mina Pager, [phone] 5:03 PM

## 2021-03-28 ENCOUNTER — Encounter (HOSPITAL_COMMUNITY): Payer: Self-pay | Admitting: Family

## 2021-03-28 ENCOUNTER — Other Ambulatory Visit: Payer: Self-pay

## 2021-03-28 ENCOUNTER — Other Ambulatory Visit (HOSPITAL_COMMUNITY): Payer: BC Managed Care – PPO | Admitting: Licensed Clinical Social Worker

## 2021-03-28 ENCOUNTER — Encounter (HOSPITAL_COMMUNITY): Payer: Self-pay

## 2021-03-28 ENCOUNTER — Other Ambulatory Visit (HOSPITAL_COMMUNITY): Payer: BC Managed Care – PPO | Admitting: Occupational Therapy

## 2021-03-28 DIAGNOSIS — F332 Major depressive disorder, recurrent severe without psychotic features: Secondary | ICD-10-CM

## 2021-03-28 DIAGNOSIS — R4589 Other symptoms and signs involving emotional state: Secondary | ICD-10-CM

## 2021-03-28 DIAGNOSIS — R41844 Frontal lobe and executive function deficit: Secondary | ICD-10-CM

## 2021-03-28 NOTE — Therapy (Signed)
[facility] PARTIAL HOSPITALIZATION PROGRAM [address] [place], [place], [address] Phone: [phone]   Fax:  [phone] Virtual Visit via Video Note  I connected with Francisco Edwards on 03/28/21 at  12:00 PM EDT by a video enabled telemedicine application and verified that I am speaking with the correct person using two identifiers.  Location: Patient: Patient Home Provider: Clinic Office    I discussed the limitations of evaluation and management by telemedicine and the availability of in person appointments. The patient expressed understanding and agreed to proceed.   I discussed the assessment and treatment plan with the patient. The patient was provided an opportunity to ask questions and all were answered. The patient agreed with the plan and demonstrated an understanding of the instructions.   The patient was advised to call back or seek an in-person evaluation if the symptoms worsen or if the condition fails to improve as anticipated.  I provided 50 minutes of non-face-to-face time during this encounter.   Donne Hazel, OT   Occupational Therapy Treatment  Patient Details  Name: Francisco Edwards MRN: [id number] Date of Birth: 02-18-1986 Referring Provider (OT): Hillery Jacks   Encounter Date: 03/28/2021   OT End of Session - 03/28/21 1342     Authorization Type BCBS DEDUCT [id number]   COPAY 75.00   NO CO INS   OOP 6500- OMET   VISIT LIMIT 45- 0 USED    Authorization - Number of Visits 45             Past Medical History:  Diagnosis Date   Anxiety    Bipolar disorder (HCC)    Depression    PTSD (post-traumatic stress disorder)     History reviewed. No pertinent surgical history.  There were no vitals filed for this visit.   Subjective Assessment - 03/28/21 1340     Currently in Pain? No/denies             OT Education - 03/28/21 1340     Education Details Educated on different communication styles and identified  strategies/tips to practice being more assertive    Person(s) Educated Patient    Methods Explanation;Handout    Comprehension Verbalized understanding              OT Short Term Goals - 03/28/21 1341       OT SHORT TERM GOAL #1   Status On-going      OT SHORT TERM GOAL #2   Status On-going      OT SHORT TERM GOAL #3   Status On-going      OT SHORT TERM GOAL #4   Status On-going           Group Session:  S: None noted  O: Today's group focused on topic of Communication Styles. Group members were educated on the different styles including passive, aggressive, and assertive communication. Members shared and reflected on which style they most often find themselves communicating in and how to transition to a more assertive approach.   A: Francisco Edwards was present in group session, camera turned off, with minimal verbal engagement, despite encouragement. Pt continues to report feeling anxious within a group setting and was largely an observer during today's session. Will continue to encourage active engagement and participation in future sessions.   P: Continue to attend PHP OT group sessions 5x week for 4 weeks to promote daily structure, social engagement, and opportunities to develop and utilize adaptive strategies to maximize functional  performance in preparation for safe transition and integration back into school, work, and the community. Plan to address topic of assertiveness in next OT group session.   Plan - 03/28/21 1341     Occupational performance deficits (Please refer to evaluation for details): ADL's;IADL's;Rest and Sleep;Education;Work;Leisure;Social Participation    Body Structure / Function / Physical Skills ADL    Cognitive Skills Attention;Emotional;Energy/Drive;Learn;Memory;Perception;Problem Solve;Safety Awareness;Temperament/Personality;Thought;Understand    Psychosocial Skills Coping Strategies;Environmental  Adaptations;Habits;Interpersonal Interaction;Routines  and Behaviors             Patient will benefit from skilled therapeutic intervention in order to improve the following deficits and impairments:   Body Structure / Function / Physical Skills: ADL Cognitive Skills: Attention, Emotional, Energy/Drive, Learn, Memory, Perception, Problem Solve, Safety Awareness, Temperament/Personality, Thought, Understand Psychosocial Skills: Coping Strategies, Environmental  Adaptations, Habits, Interpersonal Interaction, Routines and Behaviors   Visit Diagnosis: Difficulty coping  Frontal lobe and executive function deficit  Severe episode of recurrent major depressive disorder, without psychotic features (HCC)    Problem List There are no problems to display for this patient.   03/28/2021  Donne Hazel, MOT, OTR/L  03/28/2021, 1:43 PM  [facility] HOSPITALIZATION PROGRAM [address] [place], [place], [address] Phone: [phone]   Fax:  [phone]  Name: Francisco Edwards MRN: [id number] Date of Birth: 1986-01-17

## 2021-03-28 NOTE — Progress Notes (Signed)
Spoke with patient via Webex video call, used 2 identifiers to correctly identify patient. States this is his first time in [facility] and was recommended after an evaluation at [facility]. His employer asked him to be seen after he was feeling suicidal and depressed. States his mother died last year from a stroke caused by a new medication she started taking. This has been his biggest stressor. 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 States he has PTSD, anxiety, depression, and Bipolar disorder. Denies SI/HI or AV hallucinations. On scale 1-10 as 10 being worst he rates depression at 3 and anxiety at 6. PHQ9=22. No side effects from medication. No issues or complaints.

## 2021-03-29 ENCOUNTER — Other Ambulatory Visit: Payer: Self-pay

## 2021-03-29 ENCOUNTER — Other Ambulatory Visit (HOSPITAL_COMMUNITY): Payer: BC Managed Care – PPO | Admitting: Licensed Clinical Social Worker

## 2021-03-29 ENCOUNTER — Other Ambulatory Visit (HOSPITAL_COMMUNITY): Payer: BC Managed Care – PPO

## 2021-03-29 DIAGNOSIS — F332 Major depressive disorder, recurrent severe without psychotic features: Secondary | ICD-10-CM | POA: Diagnosis not present

## 2021-04-01 ENCOUNTER — Other Ambulatory Visit (HOSPITAL_COMMUNITY): Payer: BC Managed Care – PPO | Admitting: Occupational Therapy

## 2021-04-01 ENCOUNTER — Encounter (HOSPITAL_COMMUNITY): Payer: Self-pay

## 2021-04-01 ENCOUNTER — Other Ambulatory Visit: Payer: Self-pay

## 2021-04-01 ENCOUNTER — Other Ambulatory Visit (HOSPITAL_COMMUNITY): Payer: BC Managed Care – PPO | Admitting: Licensed Clinical Social Worker

## 2021-04-01 DIAGNOSIS — F332 Major depressive disorder, recurrent severe without psychotic features: Secondary | ICD-10-CM | POA: Diagnosis not present

## 2021-04-01 DIAGNOSIS — R41844 Frontal lobe and executive function deficit: Secondary | ICD-10-CM

## 2021-04-01 DIAGNOSIS — R4589 Other symptoms and signs involving emotional state: Secondary | ICD-10-CM

## 2021-04-01 NOTE — Therapy (Signed)
[facility] PARTIAL HOSPITALIZATION PROGRAM [address] [place], [place], [address] Phone: [phone]   Fax:  [phone] Virtual Visit via Video Note  I connected with Francisco Edwards on 04/01/21 at  12:00 PM EDT by a video enabled telemedicine application and verified that I am speaking with the correct person using two identifiers.  Location: Patient: Patient Home Provider: Clinic Office   I discussed the limitations of evaluation and management by telemedicine and the availability of in person appointments. The patient expressed understanding and agreed to proceed.   I discussed the assessment and treatment plan with the patient. The patient was provided an opportunity to ask questions and all were answered. The patient agreed with the plan and demonstrated an understanding of the instructions.   The patient was advised to call back or seek an in-person evaluation if the symptoms worsen or if the condition fails to improve as anticipated.  I provided 50 minutes of non-face-to-face time during this encounter.   Francisco Edwards, OT   Occupational Therapy Treatment  Patient Details  Name: Francisco Edwards MRN: [id number] Date of Birth: 10/08/1985 Referring Provider (OT): Hillery Jacks   Encounter Date: 04/01/2021   OT End of Session - 04/01/21 1313     Visit Number 3    Number of Visits 20    Date for OT Re-Evaluation 04/24/21    Authorization Type BCBS DEDUCT [id number]   COPAY 75.00   NO CO INS   OOP 6500- OMET   VISIT LIMIT 45- 0 USED    Authorization - Number of Visits 45    OT Start Time 1200    OT Stop Time 1250    OT Time Calculation (min) 50 min    Activity Tolerance Patient tolerated treatment well    Behavior During Therapy Anxious             Past Medical History:  Diagnosis Date   Anxiety    Bipolar disorder (HCC)    Depression    PTSD (post-traumatic stress disorder)     History reviewed. No pertinent surgical  history.  There were no vitals filed for this visit.   Subjective Assessment - 04/01/21 1313     Currently in Pain? No/denies                                  OT Education - 04/01/21 1313     Education Details Educated on how to navigate aggressive communication and reviewed strategies/tips to "fight fair"    Person(s) Educated Patient    Methods Explanation;Handout    Comprehension Verbalized understanding              OT Short Term Goals - 03/28/21 1341       OT SHORT TERM GOAL #1   Status On-going      OT SHORT TERM GOAL #2   Status On-going      OT SHORT TERM GOAL #3   Status On-going      OT SHORT TERM GOAL #4   Status On-going           Group Session:  S: None noted  O: Group began with a reflection from previous OT session on assertiveness skills and training. Group members shared an example of how they practiced being assertive post group and into their evening. Today's group was an expansion on assertive training and communication with a focus  on [employer]. Group members were provided with a set of 'rules' and tips on how to fight fairly, in order to come off as more assertive and less aggressive. Members identified one rule or tip they felt they struggled most with and were encouraged to focus on improving their communication within identified rule.    A: Francisco Edwards was minimally engaged in his participation of discussion/activity, as evidenced by, his camera being turned off and not engaging verbally in discussion. Pt required mod-max verbal cues to engage, however responded by stating he was listening, however did not share or contribute any of his own input or difficulties when it comes to communication. Despite lack of verbal participation, did respond and share that he was attentively listening and receptive to education provided. Will continue to promote and encourage active engagement and participation in future sessions.   P:  Continue to attend PHP OT group sessions 5x week for 2 weeks to promote daily structure, social engagement, and opportunities to develop and utilize adaptive strategies to maximize functional performance in preparation for safe transition and integration back into school, work, and the community. Plan to address topic of self-care in next OT group session.  Plan - 04/01/21 1313     Occupational performance deficits (Please refer to evaluation for details): ADL's;IADL's;Rest and Sleep;Education;Work;Leisure;Social Participation    Body Structure / Function / Physical Skills ADL    Cognitive Skills Attention;Emotional;Energy/Drive;Learn;Memory;Perception;Problem Solve;Safety Awareness;Temperament/Personality;Thought;Understand    Psychosocial Skills Coping Strategies;Environmental  Adaptations;Habits;Interpersonal Interaction;Routines and Behaviors             Patient will benefit from skilled therapeutic intervention in order to improve the following deficits and impairments:   Body Structure / Function / Physical Skills: ADL Cognitive Skills: Attention, Emotional, Energy/Drive, Learn, Memory, Perception, Problem Solve, Safety Awareness, Temperament/Personality, Thought, Understand Psychosocial Skills: Coping Strategies, Environmental  Adaptations, Habits, Interpersonal Interaction, Routines and Behaviors   Visit Diagnosis: Difficulty coping  Frontal lobe and executive function deficit  Severe episode of recurrent major depressive disorder, without psychotic features (HCC)    Problem List There are no problems to display for this patient.   04/01/2021  Francisco Edwards, MOT, OTR/L  04/01/2021, 1:14 PM  [facility] HOSPITALIZATION PROGRAM [address] [place], [place], [address] Phone: [phone]   Fax:  [phone]  Name: Francisco Edwards MRN: [id number] Date of Birth: 12/01/1985

## 2021-04-02 ENCOUNTER — Other Ambulatory Visit (HOSPITAL_COMMUNITY): Payer: BC Managed Care – PPO | Admitting: Occupational Therapy

## 2021-04-02 ENCOUNTER — Encounter (HOSPITAL_COMMUNITY): Payer: Self-pay

## 2021-04-02 ENCOUNTER — Other Ambulatory Visit (HOSPITAL_COMMUNITY): Payer: Self-pay

## 2021-04-02 ENCOUNTER — Other Ambulatory Visit: Payer: Self-pay

## 2021-04-02 ENCOUNTER — Encounter (HOSPITAL_COMMUNITY): Payer: Self-pay | Admitting: Family

## 2021-04-02 ENCOUNTER — Other Ambulatory Visit (HOSPITAL_COMMUNITY): Payer: BC Managed Care – PPO | Admitting: Licensed Clinical Social Worker

## 2021-04-02 DIAGNOSIS — R41844 Frontal lobe and executive function deficit: Secondary | ICD-10-CM

## 2021-04-02 DIAGNOSIS — F332 Major depressive disorder, recurrent severe without psychotic features: Secondary | ICD-10-CM

## 2021-04-02 DIAGNOSIS — R4589 Other symptoms and signs involving emotional state: Secondary | ICD-10-CM

## 2021-04-02 NOTE — Therapy (Signed)
[facility] PARTIAL HOSPITALIZATION PROGRAM [address] [place], [place], [address] Phone: [phone]   Fax:  [phone] Virtual Visit via Video Note  I connected with Francisco Edwards on 04/02/21 at  12:00 PM EDT by a video enabled telemedicine application and verified that I am speaking with the correct person using two identifiers.  Location: Patient: Patient Home Provider: Clinic Office   I discussed the limitations of evaluation and management by telemedicine and the availability of in person appointments. The patient expressed understanding and agreed to proceed.   I discussed the assessment and treatment plan with the patient. The patient was provided an opportunity to ask questions and all were answered. The patient agreed with the plan and demonstrated an understanding of the instructions.   The patient was advised to call back or seek an in-person evaluation if the symptoms worsen or if the condition fails to improve as anticipated.  I provided 50 minutes of non-face-to-face time during this encounter.   Donne Hazel, OT   Occupational Therapy Treatment  Patient Details  Name: Francisco Edwards MRN: [id number] Date of Birth: [birth year] Referring Provider (OT): Hillery Jacks   Encounter Date: 04/02/2021   OT End of Session - 04/02/21 1422     Visit Number 4    Number of Visits 20    Date for OT Re-Evaluation 04/24/21    Authorization Type BCBS DEDUCT [id number]   COPAY 75.00   NO CO INS   OOP 6500- OMET   VISIT LIMIT 45- 0 USED    Authorization - Number of Visits 45    OT Start Time 1200    OT Stop Time 1250    OT Time Calculation (min) 50 min    Activity Tolerance Patient tolerated treatment well    Behavior During Therapy WFL for tasks assessed/performed             Past Medical History:  Diagnosis Date   Anxiety    Bipolar disorder (HCC)    Depression    PTSD (post-traumatic stress disorder)     History reviewed. No  pertinent surgical history.  There were no vitals filed for this visit.   Subjective Assessment - 04/02/21 1418     Currently in Pain? No/denies                                  OT Education - 04/02/21 1418     Education Details Educated on low vs positive self-esteem and reviewed six strategies to boost low self-esteem    Person(s) Educated Patient    Methods Explanation;Handout    Comprehension Verbalized understanding              OT Short Term Goals - 03/28/21 1341       OT SHORT TERM GOAL #1   Status On-going      OT SHORT TERM GOAL #2   Status On-going      OT SHORT TERM GOAL #3   Status On-going      OT SHORT TERM GOAL #4   Status On-going           Group Session:  S: "I'm having a real hard time coming up with a third word to describe myself."  O: Group session encouraged increased engagement and participation through discussion focused on self-esteem. Topic of discussion focused on identifying differences between low vs high self-esteem and identified ways  in which our self-esteem impacts our daily lives including: self-criticism, ignoring positive qualities, negative emotions, work/school performance, relationships, leisure engagement, and self-care. Group members further identified ways in which their self-esteem directly impacts their daily function. Tips and strategies were shared to boost and build-up our low self-esteem, including exercise, mindfulness/meditation, postures of confidence, surrounding yourself with positive people, positive affirmations, and self-care. Group members committed to trying one of the above-mentioned strategies over their weekend.    A: Francisco Edwards was active and independent in his participation of discussion and shared that he struggles with low self-esteem at times, notably however, was more present and engaged in today's discussion than previous sessions. Pt receptive to strategies reviewed and shared a  positive affirmation that stood out to him "I get better every single day." Pt also shared three positive ways to describe himself "hardworking, helpful, and caring."  P: Continue to attend PHP OT group sessions 5x week for 2 weeks to promote daily structure, social engagement, and opportunities to develop and utilize adaptive strategies to maximize functional performance in preparation for safe transition and integration back into school, work, and the community. Plan to address topic of safety planning in next OT group session.  Plan - 04/02/21 1422     Occupational performance deficits (Please refer to evaluation for details): ADL's;IADL's;Rest and Sleep;Education;Work;Leisure;Social Participation    Body Structure / Function / Physical Skills ADL    Cognitive Skills Attention;Emotional;Energy/Drive;Learn;Memory;Perception;Problem Solve;Safety Awareness;Temperament/Personality;Thought;Understand    Psychosocial Skills Coping Strategies;Environmental  Adaptations;Habits;Interpersonal Interaction;Routines and Behaviors             Patient will benefit from skilled therapeutic intervention in order to improve the following deficits and impairments:   Body Structure / Function / Physical Skills: ADL Cognitive Skills: Attention, Emotional, Energy/Drive, Learn, Memory, Perception, Problem Solve, Safety Awareness, Temperament/Personality, Thought, Understand Psychosocial Skills: Coping Strategies, Environmental  Adaptations, Habits, Interpersonal Interaction, Routines and Behaviors   Visit Diagnosis: Difficulty coping  Frontal lobe and executive function deficit  Severe episode of recurrent major depressive disorder, without psychotic features (HCC)    Problem List There are no problems to display for this patient.   04/02/2021  Donne Hazel, MOT, OTR/L  04/02/2021, 2:23 PM  [facility] HOSPITALIZATION PROGRAM [address] [place], [place],  [address] Phone: [phone]   Fax:  [phone]  Name: Francisco Edwards MRN: [id number] Date of Birth: October 11, 1985

## 2021-04-02 NOTE — Progress Notes (Signed)
Spoke with patient via Webex video call, used 2 identifiers to correctly identify patient. States that groups are going good. He doesn't much like IOP being in the same group at [facility] this week but is dealing with it as best he can. He was happy about learning that he will be getting a raise and traveling more on the road when he returns to work. On scale 1-10 as 10 being worst he rates depression at 1 and anxiety at 0. Denies SI/HI or AV hallucinations. No other issues or complaints.

## 2021-04-02 NOTE — Progress Notes (Signed)
Virtual Visit via Video Note  I connected with Rayhan Groleau Pierro on 04/02/21 at  9:00 AM EDT by a video enabled telemedicine application and verified that I am speaking with the correct person using two identifiers.  Location: Patient: Home Provider: Office   I discussed the limitations of evaluation and management by telemedicine and the availability of in person appointments. The patient expressed understanding and agreed to proceed.   I discussed the assessment and treatment plan with the patient. The patient was provided an opportunity to ask questions and all were answered. The patient agreed with the plan and demonstrated an understanding of the instructions.   The patient was advised to call back or seek an in-person evaluation if the symptoms worsen or if the condition fails to improve as anticipated.  I provided 15 minutes of non-face-to-face time during this encounter.   Oneta Rack, NP   BH MD/PA/NP OP Progress Note  04/02/2021 11:14 AM WESSLEY EMERT  MRN:  [id number]  Chief Complaint:  Riley Lam reported "  I am doing okay."  Evaluation:  Fahd was seen and evaluated via telemetry assessment.  Denying suicidal or homicidal ideations.  Denies auditory or visual hallucinations.  Reports " becoming comfortable with group setting."  Reports a history of social anxiety.  Recently adjusted Abilify from 2 mg to 5 mg which he reports taking and tolerating well.  Reports his mood has improved since medication increase.  Patient reports he would like for this provider to follow-up with his primary care provider Dr. Hyman Hopes [facility] ) regarding continuation of diazepam recommendation.  Discussed patient to consider downward titration with diazepam.  As reported he was prescribed Xanax in the past with a "possible addiction", stated that he does not feel addicted to diazepam at this time. " I can stop when I want to."   Patient reports drinking 3 beers daily.  Patient appears to  minimizes use with Etoh and medications.  Discussed risks and precautions related to medication and alcohol use.  Patient appeared receptive.  NP attempted to follow-up with primary care left message.  Patient reports a good appetite.  States he is resting well throughout the night.  Patient to continue partial hospitalization programming.  Support , encouragement  and reassurance was provided.   Visit Diagnosis:    ICD-10-CM   1. Severe episode of recurrent major depressive disorder, without psychotic features (HCC)  F33.2       Past Psychiatric History:   Past Medical History:  Past Medical History:  Diagnosis Date   Anxiety    Bipolar disorder (HCC)    Depression    PTSD (post-traumatic stress disorder)    No past surgical history on file.  Family Psychiatric History:  Family History:  Family History  Problem Relation Age of Onset   Depression Mother     Social History:  Social History   Socioeconomic History   Marital status: Single    Spouse name: Not on file   Number of children: 0   Years of education: Not on file   Highest education level: 10th grade  Occupational History   Not on file  Tobacco Use   Smoking status: Every Day   Smokeless tobacco: Former  Building services engineer Use: Former  Substance and Sexual Activity   Alcohol use: Not Currently    Alcohol/week: 3.0 standard drinks    Types: 3 Cans of beer per week    Comment: 3 beers daily  Drug use: Never   Sexual activity: Not on file  Other Topics Concern   Not on file  Social History Narrative   Not on file   Social Determinants of Health   Financial Resource Strain: Not on file  Food Insecurity: Not on file  Transportation Needs: Not on file  Physical Activity: Not on file  Stress: Not on file  Social Connections: Not on file    Allergies: No Known Allergies  Metabolic Disorder Labs: No results found for: HGBA1C, MPG No results found for: PROLACTIN No results found for: CHOL,  TRIG, HDL, CHOLHDL, VLDL, LDLCALC No results found for: TSH  Therapeutic Level Labs: No results found for: LITHIUM No results found for: VALPROATE No components found for:  CBMZ  Current Medications: Current Outpatient Medications  Medication Sig Dispense Refill   ARIPiprazole (ABILIFY) 5 MG tablet Take 1 tablet (5 mg total) by mouth daily. 30 tablet 0   Aspirin-Salicylamide-Caffeine (BC HEADACHE POWDER PO) Take 1 Package by mouth daily as needed (for pain).     bismuth subsalicylate (PEPTO BISMOL) 262 MG/15ML suspension Take 15 mLs by mouth every 6 (six) hours as needed (stomach pain). (Patient not taking: Reported on 03/28/2021)     buPROPion (WELLBUTRIN) 100 MG tablet Take 100 mg by mouth 2 (two) times daily.     diazepam (VALIUM) 10 MG tablet Take 10 mg by mouth 2 (two) times daily at 10 AM and 5 PM.     omeprazole (PRILOSEC) 20 MG capsule Take 1 capsule (20 mg total) by mouth daily. (Patient not taking: Reported on 03/28/2021) 30 capsule 0   No current facility-administered medications for this visit.     Musculoskeletal: Strength & Muscle Tone: within normal limits Gait & Station: normal Patient leans: N/A  Psychiatric Specialty Exam: Review of Systems  There were no vitals taken for this visit.There is no height or weight on file to calculate BMI.  General Appearance: Casual  Eye Contact:  Fair  Speech:  Clear and Coherent  Volume:  Normal  Mood:  Anxious and Depressed  Affect:  Congruent  Thought Process:  Coherent  Orientation:  Full (Time, Place, and Person)  Thought Content: Logical   Suicidal Thoughts:  No  Homicidal Thoughts:  No  Memory:  Immediate;   Fair Recent;   Fair  Judgement:  Fair  Insight:  Fair  Psychomotor Activity:  Normal  Concentration:  Concentration: Good  Recall:  Fair  Fund of Knowledge: Fair  Language: Good  Akathisia:  No  Handed:  Right  AIMS (if indicated): done  Assets:  Communication Skills Desire for  Improvement Resilience Social Support  ADL's:  Intact  Cognition: WNL  Sleep:  Fair   Screenings: Insurance account manager from 03/28/2021 [facility]  PHQ-2 Total Score 6  PHQ-9 Total Score 22        Assessment and Plan:  Patient to continue partial hospitalization programming Continue Abilify 5 mg p.o. daily continue Wellbutrin 150 mg daily.  NP attempted to follow-up with patient's primary care left message -Consider follow-up with substance abuse treatment  Treatment plan was reviewed and agreed upon by NP T. Shayden Bobier and patient Evaan Tidwell need for continued group services   Oneta Rack, NP 04/02/2021, 11:14 AM

## 2021-04-03 ENCOUNTER — Encounter (HOSPITAL_COMMUNITY): Payer: Self-pay

## 2021-04-03 ENCOUNTER — Other Ambulatory Visit: Payer: Self-pay

## 2021-04-03 ENCOUNTER — Other Ambulatory Visit (HOSPITAL_COMMUNITY): Payer: BC Managed Care – PPO | Admitting: Licensed Clinical Social Worker

## 2021-04-03 ENCOUNTER — Other Ambulatory Visit (HOSPITAL_COMMUNITY): Payer: BC Managed Care – PPO | Admitting: Occupational Therapy

## 2021-04-03 DIAGNOSIS — F332 Major depressive disorder, recurrent severe without psychotic features: Secondary | ICD-10-CM | POA: Diagnosis not present

## 2021-04-03 DIAGNOSIS — R41844 Frontal lobe and executive function deficit: Secondary | ICD-10-CM

## 2021-04-03 DIAGNOSIS — R4589 Other symptoms and signs involving emotional state: Secondary | ICD-10-CM

## 2021-04-03 NOTE — Therapy (Signed)
[facility] PARTIAL HOSPITALIZATION PROGRAM [address] [place], [place], [address] Phone: [phone]   Fax:  [phone] Virtual Visit via Video Note  I connected with Francisco Edwards on 04/03/21 at  12:00 PM EDT by a video enabled telemedicine application and verified that I am speaking with the correct person using two identifiers.  Location: Patient: Patient Home Provider: Clinic Office   I discussed the limitations of evaluation and management by telemedicine and the availability of in person appointments. The patient expressed understanding and agreed to proceed.   I discussed the assessment and treatment plan with the patient. The patient was provided an opportunity to ask questions and all were answered. The patient agreed with the plan and demonstrated an understanding of the instructions.   The patient was advised to call back or seek an in-person evaluation if the symptoms worsen or if the condition fails to improve as anticipated.  I provided 45 minutes of non-face-to-face time during this encounter.   Donne Hazel, OT   Occupational Therapy Treatment  Patient Details  Name: Francisco Edwards MRN: [id number] Date of Birth: 11-Jun-1986 Referring Provider (OT): Hillery Jacks   Encounter Date: 04/03/2021   OT End of Session - 04/03/21 1441     Visit Number 5    Number of Visits 20    Date for OT Re-Evaluation 04/24/21    Authorization Type BCBS DEDUCT [id number]   COPAY 75.00   NO CO INS   OOP 6500- OMET   VISIT LIMIT 45- 0 USED    Authorization - Number of Visits 45    OT Start Time 1205    OT Stop Time 1250    OT Time Calculation (min) 45 min    Activity Tolerance Patient tolerated treatment well    Behavior During Therapy WFL for tasks assessed/performed             Past Medical History:  Diagnosis Date   Anxiety    Bipolar disorder (HCC)    Depression    PTSD (post-traumatic stress disorder)     History reviewed. No  pertinent surgical history.  There were no vitals filed for this visit.   Subjective Assessment - 04/03/21 1440     Currently in Pain? No/denies                                  OT Education - 04/03/21 1441     Education Details Educated on identifying coping strategies, social supports, and community mental health resources available through use of safety planning tool    Person(s) Educated Patient    Methods Explanation;Handout    Comprehension Verbalized understanding              OT Short Term Goals - 03/28/21 1341       OT SHORT TERM GOAL #1   Status On-going      OT SHORT TERM GOAL #2   Status On-going      OT SHORT TERM GOAL #3   Status On-going      OT SHORT TERM GOAL #4   Status On-going           Group Session:  S: "I can't think of any coping skills right now"  O: Today's group discussion focused on the topic of Safety Planning. Patients were educated on what a safety plan is, what it can be used for, and why we  should create one. Group then worked collaboratively and independently to create an individualized safety plan including identifying warning signs, coping strategies, places to go for distraction, social supports, professional supports, and how to make the environment safe. Group members were also encouraged to reflect on the question "What is life worth living for?" Group session ended with patients encouraged to utilize their safety plan as an all-inclusive resource when experiencing a mental health crisis.    A: Francisco Edwards was receptive to [employer] cues to engage in group discussion, sharing that has trouble identifying his coping skills or when he is experiencing warning signs. Pt identified benefit from distractions including  going to car shows and spending time in his game room playing video games. Appeared receptive to further education on safety planning.   P: Continue to attend PHP OT group sessions 5x week for 2 weeks  to promote daily structure, social engagement, and opportunities to develop and utilize adaptive strategies to maximize functional performance in preparation for safe transition and integration back into school, work, and the community.    Plan - 04/03/21 1441     Occupational performance deficits (Please refer to evaluation for details): ADL's;IADL's;Rest and Sleep;Education;Work;Leisure;Social Participation    Body Structure / Function / Physical Skills ADL    Cognitive Skills Attention;Emotional;Energy/Drive;Learn;Memory;Perception;Problem Solve;Safety Awareness;Temperament/Personality;Thought;Understand    Psychosocial Skills Coping Strategies;Environmental  Adaptations;Habits;Interpersonal Interaction;Routines and Behaviors             Patient will benefit from skilled therapeutic intervention in order to improve the following deficits and impairments:   Body Structure / Function / Physical Skills: ADL Cognitive Skills: Attention, Emotional, Energy/Drive, Learn, Memory, Perception, Problem Solve, Safety Awareness, Temperament/Personality, Thought, Understand Psychosocial Skills: Coping Strategies, Environmental  Adaptations, Habits, Interpersonal Interaction, Routines and Behaviors   Visit Diagnosis: Difficulty coping  Frontal lobe and executive function deficit  Severe episode of recurrent major depressive disorder, without psychotic features (HCC)    Problem List There are no problems to display for this patient.   04/03/2021  Donne Hazel, MOT, OTR/L  04/03/2021, 2:41 PM  [facility] HOSPITALIZATION PROGRAM [address] [place], [place], [address] Phone: [phone]   Fax:  [phone]  Name: Francisco Edwards MRN: [id number] Date of Birth: 05-Dec-1985

## 2021-04-04 ENCOUNTER — Other Ambulatory Visit: Payer: Self-pay

## 2021-04-04 ENCOUNTER — Other Ambulatory Visit (HOSPITAL_COMMUNITY): Payer: BC Managed Care – PPO | Admitting: Licensed Clinical Social Worker

## 2021-04-04 ENCOUNTER — Other Ambulatory Visit (HOSPITAL_COMMUNITY): Payer: BC Managed Care – PPO

## 2021-04-04 DIAGNOSIS — F332 Major depressive disorder, recurrent severe without psychotic features: Secondary | ICD-10-CM | POA: Diagnosis not present

## 2021-04-05 ENCOUNTER — Other Ambulatory Visit: Payer: Self-pay

## 2021-04-05 ENCOUNTER — Other Ambulatory Visit (HOSPITAL_COMMUNITY): Payer: BC Managed Care – PPO

## 2021-04-05 ENCOUNTER — Other Ambulatory Visit (HOSPITAL_COMMUNITY): Payer: BC Managed Care – PPO | Admitting: Licensed Clinical Social Worker

## 2021-04-05 DIAGNOSIS — F332 Major depressive disorder, recurrent severe without psychotic features: Secondary | ICD-10-CM

## 2021-04-08 ENCOUNTER — Other Ambulatory Visit (HOSPITAL_COMMUNITY): Payer: BC Managed Care – PPO

## 2021-04-08 ENCOUNTER — Other Ambulatory Visit (HOSPITAL_COMMUNITY): Payer: BC Managed Care – PPO | Admitting: Licensed Clinical Social Worker

## 2021-04-08 ENCOUNTER — Other Ambulatory Visit: Payer: Self-pay

## 2021-04-08 DIAGNOSIS — F332 Major depressive disorder, recurrent severe without psychotic features: Secondary | ICD-10-CM | POA: Diagnosis not present

## 2021-04-09 ENCOUNTER — Encounter (HOSPITAL_COMMUNITY): Payer: Self-pay

## 2021-04-09 ENCOUNTER — Other Ambulatory Visit (HOSPITAL_COMMUNITY): Payer: BC Managed Care – PPO

## 2021-04-09 ENCOUNTER — Other Ambulatory Visit: Payer: Self-pay

## 2021-04-09 ENCOUNTER — Other Ambulatory Visit (HOSPITAL_COMMUNITY): Payer: BC Managed Care – PPO | Admitting: Licensed Clinical Social Worker

## 2021-04-09 DIAGNOSIS — F332 Major depressive disorder, recurrent severe without psychotic features: Secondary | ICD-10-CM | POA: Diagnosis not present

## 2021-04-09 NOTE — Progress Notes (Signed)
Virtual Visit via Video Note  I connected with Francisco Edwards on 04/09/21 at  9:00 AM EDT by a video enabled telemedicine application and verified that I am speaking with the correct person using two identifiers.  At orientation to the [facility] program, Case Manager discussed the limitations of evaluation and management by telemedicine and the availability of in person appointments. The patient expressed understanding and agreed to proceed with virtual visits throughout the duration of the program.   Location:  Patient: Patient Home Provider: Home Office   History of Present Illness: MDD  Observations/Objective: Check In: Case Manager checked in with all participants to review discharge dates, insurance authorizations, work-related documents and needs from the treatment team regarding medications. Client stated needs and engaged in discussion.   Initial Therapeutic Activity: Counselor facilitated a check-in with group members to assess mood and current functioning. Client shared details of their mental health management since our last session, including challenges and successes. Counselor engaged group in discussion, covering the following topics: ECOMaps, Support Systems, boundaries, assertive communication, relational dynamics and traumas and self-care. Client presents with severe depression and severe anxiety. Client denied any current SI/HI/psychosis.  Second Therapeutic Activity: Counselor introduced [employer], [place] Chaplain to present information and discussion on Grief and Loss. Group members engaged in discussion, sharing how grief impacts them, what comforts them, what emotions are felt, labeling losses, etc. After guest speaker logged off, Counselor prompted group to spend 10-15 minutes journaling to process personal grief and loss situations. Counselor processed entries with group and client's identified areas for additional processing in individual therapy and local support groups.  Client engaged well in discussion.  Check Out: Counselor prompted group members to identify one self-care practice or productivity activity they would like to engage in this today. Counselor encouraged Client to be kind and compassionate with self, as grief and loss usually will bring up residual thoughts and feelings. Client endorsed safety plan to be followed to prevent safety issues.  Assessment and Plan: Clinician recommends that Client remain in PHP treatment to better manage mental health symptoms, stabilization and to address treatment plan goals. Clinician recommends adherence to crisis/safety plan, taking medications as prescribed, and following up with medical professionals if any issues arise.    Follow Up Instructions: Clinician will send Webex link for next session. The Client was advised to call back or seek an in-person evaluation if the symptoms worsen or if the condition fails to improve as anticipated.     I provided 180 minutes of non-face-to-face time during this encounter.     Hilbert Odor, LCSW

## 2021-04-10 ENCOUNTER — Other Ambulatory Visit: Payer: Self-pay

## 2021-04-10 ENCOUNTER — Other Ambulatory Visit (HOSPITAL_COMMUNITY): Payer: BC Managed Care – PPO | Admitting: Occupational Therapy

## 2021-04-10 ENCOUNTER — Encounter (HOSPITAL_COMMUNITY): Payer: Self-pay

## 2021-04-10 ENCOUNTER — Other Ambulatory Visit (HOSPITAL_COMMUNITY): Payer: BC Managed Care – PPO | Admitting: Licensed Clinical Social Worker

## 2021-04-10 DIAGNOSIS — F332 Major depressive disorder, recurrent severe without psychotic features: Secondary | ICD-10-CM

## 2021-04-10 DIAGNOSIS — R4589 Other symptoms and signs involving emotional state: Secondary | ICD-10-CM

## 2021-04-10 DIAGNOSIS — R41844 Frontal lobe and executive function deficit: Secondary | ICD-10-CM

## 2021-04-10 NOTE — Therapy (Signed)
[facility] PARTIAL HOSPITALIZATION PROGRAM [address] [place], [place], [address] Phone: [phone]   Fax:  [phone] Virtual Visit via Video Note  I connected with Francisco Edwards on 04/10/21 at  12:00 PM EDT by a video enabled telemedicine application and verified that I am speaking with the correct person using two identifiers.  Location: Patient: Patient Home Provider: Clinic Office   I discussed the limitations of evaluation and management by telemedicine and the availability of in person appointments. The patient expressed understanding and agreed to proceed.   I discussed the assessment and treatment plan with the patient. The patient was provided an opportunity to ask questions and all were answered. The patient agreed with the plan and demonstrated an understanding of the instructions.   The patient was advised to call back or seek an in-person evaluation if the symptoms worsen or if the condition fails to improve as anticipated.  I provided 50 minutes of non-face-to-face time during this encounter.   Donne Hazel, OT   Occupational Therapy Treatment  Patient Details  Name: Francisco Edwards MRN: [id number] Date of Birth: 11-06-1985 Referring Provider (OT): Hillery Jacks   Encounter Date: 04/10/2021   OT End of Session - 04/10/21 1513     Visit Number 6    Number of Visits 20    Date for OT Re-Evaluation 04/24/21    Authorization Type BCBS DEDUCT [id number]   COPAY 75.00   NO CO INS   OOP 6500- OMET   VISIT LIMIT 45- 0 USED    Authorization - Number of Visits 45    OT Start Time 1200    OT Stop Time 1250    OT Time Calculation (min) 50 min    Activity Tolerance Patient tolerated treatment well    Behavior During Therapy WFL for tasks assessed/performed             Past Medical History:  Diagnosis Date   Anxiety    Bipolar disorder (HCC)    Depression    PTSD (post-traumatic stress disorder)     History reviewed. No  pertinent surgical history.  There were no vitals filed for this visit.   Subjective Assessment - 04/10/21 1513     Currently in Pain? No/denies                                  OT Education - 04/10/21 1513     Education Details Educated on progressive muscle relaxation and other additional relaxation strategies and techniques with active practice    Person(s) Educated Patient    Methods Explanation;Handout    Comprehension Verbalized understanding              OT Short Term Goals - 03/28/21 1341       OT SHORT TERM GOAL #1   Status On-going      OT SHORT TERM GOAL #2   Status On-going      OT SHORT TERM GOAL #3   Status On-going      OT SHORT TERM GOAL #4   Status On-going           Group Session:  S: "Deep breathing really helps me out"  O:  Group began with a warm-up activity and group members were encouraged to share and identify ways in which they have practiced relaxation in the past, including any specific strategies or hobbies. Today's discussion focused  on the topic of RELAXATION and patients reviewed and engaged in a variety of relaxation strategies techniques including deep breathing, guided imagery/meditation, and progressive muscle relaxation. After each strategy was reviewed, group members were invited to engage in an active practice of relaxation strategies identified. Review and background on the fight-or-flight response was also provided.   A: Francisco Edwards was active and independent in his participation of discussion and activity, sharing that he uses deep breathing as his current relaxation strategy and identifies benefit from it, though recognized he does not have a specific technique. Pt appeared open and receptive to strategies reviewed during session and identified imagery and PMR as new strategies he would like to try out, noting he was following along with the PMR as it was being described.   P: Continue to attend PHP OT group  sessions 5x week for 1 weeks to promote daily structure, social engagement, and opportunities to develop and utilize adaptive strategies to maximize functional performance in preparation for safe transition and integration back into school, work, and the community. Plan to address topic of stress management in next OT group session.  Plan - 04/10/21 1513     Occupational performance deficits (Please refer to evaluation for details): ADL's;IADL's;Rest and Sleep;Education;Work;Leisure;Social Participation    Body Structure / Function / Physical Skills ADL    Cognitive Skills Attention;Emotional;Energy/Drive;Learn;Memory;Perception;Problem Solve;Safety Awareness;Temperament/Personality;Thought;Understand    Psychosocial Skills Coping Strategies;Environmental  Adaptations;Habits;Interpersonal Interaction;Routines and Behaviors             Patient will benefit from skilled therapeutic intervention in order to improve the following deficits and impairments:   Body Structure / Function / Physical Skills: ADL Cognitive Skills: Attention, Emotional, Energy/Drive, Learn, Memory, Perception, Problem Solve, Safety Awareness, Temperament/Personality, Thought, Understand Psychosocial Skills: Coping Strategies, Environmental  Adaptations, Habits, Interpersonal Interaction, Routines and Behaviors   Visit Diagnosis: Difficulty coping  Frontal lobe and executive function deficit  Severe episode of recurrent major depressive disorder, without psychotic features (HCC)    Problem List There are no problems to display for this patient.   04/10/2021  Donne Hazel, MOT, OTR/L  04/10/2021, 3:14 PM  [facility] HOSPITALIZATION PROGRAM [address] [place], [place], [address] Phone: [phone]   Fax:  [phone]  Name: Francisco Edwards MRN: [id number] Date of Birth: 04/30/1986

## 2021-04-10 NOTE — Progress Notes (Signed)
Spiritual care group    11:00-12:00  Group met via web-ex due to COVID-19 precautions.  Group facilitated by Kathrynn Humble, BCC   Group focused on topic of "self-care"  Patients engaged in facilitated discussion about topic.  Explored quotes related to self care and chose one which they agreed with and one which they disliked.  Engaged in discussion around quote choices and their experience / understanding of care for themselves.    Patient Progress:  Francisco Edwards engaged in the group conversation and was able to share some of his stressors and some of the ways that he works to put them down including by working on one at a time.  Chaplain Janne Napoleon, Bcc Pager, [phone] 12:13 PM

## 2021-04-11 ENCOUNTER — Other Ambulatory Visit: Payer: Self-pay

## 2021-04-11 ENCOUNTER — Other Ambulatory Visit (HOSPITAL_COMMUNITY): Payer: BC Managed Care – PPO | Admitting: Licensed Clinical Social Worker

## 2021-04-11 ENCOUNTER — Encounter (HOSPITAL_COMMUNITY): Payer: Self-pay

## 2021-04-11 ENCOUNTER — Other Ambulatory Visit (HOSPITAL_COMMUNITY): Payer: BC Managed Care – PPO | Admitting: Occupational Therapy

## 2021-04-11 DIAGNOSIS — R41844 Frontal lobe and executive function deficit: Secondary | ICD-10-CM

## 2021-04-11 DIAGNOSIS — R4589 Other symptoms and signs involving emotional state: Secondary | ICD-10-CM

## 2021-04-11 DIAGNOSIS — F332 Major depressive disorder, recurrent severe without psychotic features: Secondary | ICD-10-CM | POA: Diagnosis not present

## 2021-04-11 NOTE — Progress Notes (Signed)
Virtual Visit via Video Note  I connected with Francisco Edwards on 04/11/21 at  9:00 AM EDT by a video enabled telemedicine application and verified that I am speaking with the correct person using two identifiers.  Location: Patient: Home Provider: Office   I discussed the limitations of evaluation and management by telemedicine and the availability of in person appointments. The patient expressed understanding and agreed to proceed.      I discussed the assessment and treatment plan with the patient. The patient was provided an opportunity to ask questions and all were answered. The patient agreed with the plan and demonstrated an understanding of the instructions.   The patient was advised to call back or seek an in-person evaluation if the symptoms worsen or if the condition fails to improve as anticipated.  I provided 15 minutes of non-face-to-face time during this encounter.   Oneta Rack, NP   [facility] Partial Hospitalization  Outpatient Program Discharge Summary  Francisco Edwards [id number]  Admission date: 03/27/21 Discharge date: 04/12/2021  Reason for admission: Per admission assessment note: Wandell Scullion is a 35 y.o. Caucasian male presents with suicidal ideations, depression and anxiety.  Reported " I told my boss, I am got to shoot myself in the head."  Reports struggling with depression for a while.  Reported unresolved grief and loss related to the passing of his mother 7 years prior.  States he is currently employed by [employer] which he states has been stressful at times.  Reports drinking alcohol daily. "  Like 3 beers a day but I am not a alcoholic."  Reports he has attended alcohol Anonymous in the past and does not feel as if he needs of assistance with substance abuse currently.  reports he is currently prescribed Abilify 2 mg, Valium 5 mg and Wellbutrin 150 mg .  He reports taking and tolerating medications well.  Patient is requesting a refill  with Abilify 2 mg which he reported he was recently diagnosed with Bipolar disorder. Discussed increasing Abilify 2 mg to 5 mg. Patient was receptive to plan.  Patient validates information in below assessment." Patient was enrolled in partial psychiatric program on 03/27/21.  Chemical Use History: Continues to report drinking daily.  Stated " I had 3 beers last night" continue to discourage alcohol use with medication.  Patient has poor insight related to cause-and-effect of medication diazepam and alcohol use/abuse  Progress in Program Toward Treatment Goals: Ongoing, patient attended and participated with daily group session with active and engaged participation.  He reports that he has recently self titrated Valium. " I have been taken 1/2 pill and my doctor is aware."  Declined additional outpatient resources for substance abuse or intensive outpatient program.   Progress (rationale): Keep follow-up appointment with primary care provider - Dr Hyman Hopes. -Consider chemical dependency outpatient programming  Take all medications as prescribed. Keep all follow-up appointments as scheduled.  Do not consume alcohol or use illegal drugs while on prescription medications. Report any adverse effects from your medications to your primary care provider promptly.  In the event of recurrent symptoms or worsening symptoms, call 911, a crisis hotline, or go to the nearest emergency department for evaluation.    Oneta Rack, NP 04/11/2021

## 2021-04-11 NOTE — Progress Notes (Signed)
Spoke with patient via Webex video call, used 2 identifiers to correctly identify patient. States that groups are going great. He has been taking notes and keeping the print outs to place in a binder for future reference. Has been talking more in groups and feels much better about his future. He has been taking his medications differently than prescribed. Only feels like he needs a 1/2 tablet of ativan once daily and takes 100mg  of Wellbutrin daily. He will be discharged this week and will see a therapist next week for his first appointment. On scale 1-10 as 10 being worst he rates depression at 0 and anxiety at 1. Denies SI/HI or AV hallucinations. PHQ9=3. No side effects from medication. No issues or complaints.

## 2021-04-11 NOTE — Therapy (Signed)
[facility] PARTIAL HOSPITALIZATION PROGRAM [address] [place], [place], [address] Phone: [phone]   Fax:  [phone] Virtual Visit via Video Note  I connected with Francisco Edwards on 04/11/21 at  11:00 AM EDT by a video enabled telemedicine application and verified that I am speaking with the correct person using two identifiers.  Location: Patient: Patient Home Provider: Clinic Office   I discussed the limitations of evaluation and management by telemedicine and the availability of in person appointments. The patient expressed understanding and agreed to proceed.   I discussed the assessment and treatment plan with the patient. The patient was provided an opportunity to ask questions and all were answered. The patient agreed with the plan and demonstrated an understanding of the instructions.   The patient was advised to call back or seek an in-person evaluation if the symptoms worsen or if the condition fails to improve as anticipated.  I provided 60 minutes of non-face-to-face time during this encounter.   Donne Hazel, OT   Occupational Therapy Treatment  Patient Details  Name: Francisco Edwards Date of Birth: 1985/09/25 Referring Provider (OT): Hillery Jacks   Encounter Date: 04/11/2021   OT End of Session - 04/11/21 1218     Visit Number 7    Number of Visits 20    Date for OT Re-Evaluation 04/24/21    Authorization Type BCBS DEDUCT [id number]   COPAY 75.00   NO CO INS   OOP 6500- OMET   VISIT LIMIT 45- 0 USED    Authorization - Number of Visits 45    OT Start Time 1105    OT Stop Time 1205    OT Time Calculation (min) 60 min    Activity Tolerance Patient tolerated treatment well    Behavior During Therapy WFL for tasks assessed/performed             Past Medical History:  Diagnosis Date   Anxiety    Bipolar disorder (HCC)    Depression    PTSD (post-traumatic stress disorder)     History reviewed. No  pertinent surgical history.  There were no vitals filed for this visit.   Subjective Assessment - 04/11/21 1218     Currently in Pain? No/denies                                  OT Education - 04/11/21 1218     Education Details Educated on concept of sensory modulation and self-soothing as coping strategies through use of the eight senses    Person(s) Educated Patient    Methods Explanation;Handout    Comprehension Verbalized understanding              OT Short Term Goals - 03/28/21 1341       OT SHORT TERM GOAL #1   Status On-going      OT SHORT TERM GOAL #2   Status On-going      OT SHORT TERM GOAL #3   Status On-going      OT SHORT TERM GOAL #4   Status On-going           Group Session:  S: "Cleaning is alerting for me, that'll wake me right up."  O: Today's group session focused on topic of sensory modulation and self-soothing through use of the 8 senses. Discussion introduced the concept of sensory modulation and integration, focusing on how  we can utilize our body and it's senses to self-soothe or cope, when we are experiencing an over or under-whelming sensation or feeling. Group members were introduced to a sensory diet checklist as a helpful tool/resource that can be utilized to identify what activities and strategies we prefer and do not prefer based upon our response to different stimulus. The concept of alerting vs calming activities was also introduced to understand how to counteract how we are feeling (Example: when we are feeling overwhelmed/stressed, engage in something calming. When we are feeling depressed/low energy, engage in something alerting). Group members engaged actively in discussion sharing their own personal sensory likes/dislikes.    A:  Francisco Edwards was active and independent in his participation of discussion and activity, sharing several different activities that he finds both alerting and calming as ways to self  soothe. Pt identified calming activities for himself are "going for a drive or going to the lake." Pt identified alerting activities for himself are "cleaning, playing video games, and listening to music." Appeared open and receptive to additional strategies/suggestions and peer feedback.  P: Continue to attend PHP OT group sessions 5x week for the remainder of the week to promote daily structure, social engagement, and opportunities to develop and utilize adaptive strategies to maximize functional performance in preparation for safe transition and integration back into school, work, and the community. Plan to address topic of budgeting/financial management in next OT group session.   Plan - 04/11/21 1218     Occupational performance deficits (Please refer to evaluation for details): ADL's;IADL's;Rest and Sleep;Education;Work;Leisure;Social Participation    Body Structure / Function / Physical Skills ADL    Cognitive Skills Attention;Emotional;Energy/Drive;Learn;Memory;Perception;Problem Solve;Safety Awareness;Temperament/Personality;Thought;Understand    Psychosocial Skills Coping Strategies;Environmental  Adaptations;Habits;Interpersonal Interaction;Routines and Behaviors             Patient will benefit from skilled therapeutic intervention in order to improve the following deficits and impairments:   Body Structure / Function / Physical Skills: ADL Cognitive Skills: Attention, Emotional, Energy/Drive, Learn, Memory, Perception, Problem Solve, Safety Awareness, Temperament/Personality, Thought, Understand Psychosocial Skills: Coping Strategies, Environmental  Adaptations, Habits, Interpersonal Interaction, Routines and Behaviors   Visit Diagnosis: Difficulty coping  Frontal lobe and executive function deficit  Severe episode of recurrent major depressive disorder, without psychotic features (HCC)    Problem List There are no problems to display for this patient.   04/11/2021   Donne Hazel, MOT, OTR/L  04/11/2021, 12:19 PM  [facility] HOSPITALIZATION PROGRAM [address] [place], [place], [address] Phone: [phone]   Fax:  [phone]  Name: Francisco Edwards Date of Birth: [birth year]

## 2021-04-12 ENCOUNTER — Encounter (HOSPITAL_COMMUNITY): Payer: Self-pay

## 2021-04-12 ENCOUNTER — Other Ambulatory Visit (HOSPITAL_COMMUNITY): Payer: BC Managed Care – PPO | Admitting: Licensed Clinical Social Worker

## 2021-04-12 ENCOUNTER — Other Ambulatory Visit (HOSPITAL_COMMUNITY): Payer: BC Managed Care – PPO | Admitting: Occupational Therapy

## 2021-04-12 ENCOUNTER — Other Ambulatory Visit: Payer: Self-pay

## 2021-04-12 DIAGNOSIS — R4589 Other symptoms and signs involving emotional state: Secondary | ICD-10-CM

## 2021-04-12 DIAGNOSIS — F332 Major depressive disorder, recurrent severe without psychotic features: Secondary | ICD-10-CM | POA: Diagnosis not present

## 2021-04-12 DIAGNOSIS — R41844 Frontal lobe and executive function deficit: Secondary | ICD-10-CM

## 2021-04-12 NOTE — Therapy (Signed)
Francisco Edwards [place], [name], [address] Phone: [phone]   Fax:  [phone] Virtual Visit via Video Note  I connected with Francisco Edwards on 04/12/21 at  11:00 AM EDT by a video enabled telemedicine application and verified that I am speaking with the correct person using two identifiers.  Location: Patient: Patient Home Provider: Clinic Office   I discussed the limitations of evaluation and management by telemedicine and the availability of in person appointments. The patient expressed understanding and agreed to proceed.   I discussed the assessment and treatment plan with the patient. The patient was provided an opportunity to ask questions and all were answered. The patient agreed with the plan and demonstrated an understanding of the instructions.   The patient was advised to call back or seek an in-person evaluation if the symptoms worsen or if the condition fails to improve as anticipated.  I provided 60 minutes of non-face-to-face time during this encounter.   Francisco Edwards, OT   Occupational Therapy Treatment  Patient Details  Name: Francisco Edwards MRN: [id number] Date of Birth: 10/11/1985 Referring Provider (OT): Ricky Ala   Encounter Date: 04/12/2021   OT End of Session - 04/12/21 1427     Visit Number 8    Number of Visits 20    Date for OT Re-Evaluation 04/24/21    Authorization Type BCBS DEDUCT [id number]   COPAY 75.00   NO CO INS   OOP 6500- OMET   VISIT LIMIT 45- 0 USED    Authorization - Number of Visits 73    OT Start Time 1100    OT Stop Time 1200    OT Time Calculation (min) 60 min    Activity Tolerance Patient tolerated treatment well    Behavior During Therapy WFL for tasks assessed/performed             Past Medical History:  Diagnosis Date   Anxiety    Bipolar disorder ([place])    Depression    PTSD (post-traumatic stress disorder)     History reviewed. No  pertinent surgical history.  There were no vitals filed for this visit.   Subjective Assessment - 04/12/21 1426     Currently in Pain? No/denies             OT Education - 04/12/21 1426     Education Details Educated on Academic librarian) Educated Patient    Methods Explanation;Handout    Comprehension Verbalized understanding              OT Short Term Goals - 04/12/21 1427       OT SHORT TERM GOAL #1   Title Pt will actively engage in OT group sessions throughout duration of PHP programming, in order to promote daily structure, social engagement, and opportunities to develop and utilize adaptive strategies to maximize functional performance in preparation for safe transition and integration back into school, work, and the community.    Status Achieved      OT SHORT TERM GOAL #2   Title Pt will identify and implement 1-3 sleep hygiene strategies he can utilize, in order to improve sleep quality/ADL performance, in preparation for safe and healthy reintegration back into the community at discharge.    Status Achieved      OT SHORT TERM GOAL #3   Title Pt will practice and identify 1-3 adaptive coping strategies he can utilize, in order  to safely manage increased depression/anxiety, without engaging in self-harm behaviors, with min cues, in preparation for safe and healthy reintegration back into the community at discharge.    Status Achieved      OT SHORT TERM GOAL #4   Title Pt will demonstrate an increase in assertive communication skills, as evidenced by, identifying at least 2 "I" statements when sharing their feelings/needs, with min cues, in preparation for reintegration back into the community at discharge.    Status Achieved           Group Session:  S: "I just overspend my money and buy everything I don't need from [employer]."  O: Group encouraged increased engagement and participation through discussion focused  on financial management and budgeting. Group members shared their current budgeting strategies, if any, and shared if they are satisfied with their strategies or had room for growth/improvement. Education and additional strategies were provided including use of several budgeting tools and saving techniques.   A: Francisco Edwards was active in his participation of discussion and activity, sharing that he struggles with overspending his money and will often buy things he does not need from [employer]. Pt was provided with practical strategies to save money, while also addressing strategies to fill the urge that we often get in the moment when we want to impulsively purchase something.   P: Pt will be discharged from the [facility] program, effective today, 04/12/2021, with plan to follow-up with individual provider for ongoing therapy and treatment needs.  OCCUPATIONAL THERAPY DISCHARGE SUMMARY  Visits from Start of Care: 9  Current functional level related to goals / functional outcomes: Francisco Edwards has achieved 4/4 OT goals during his time in the [facility] program. Initially, pt was hesitant to engage and often presented anxiously, with his camera turned off. As group progressed over the two weeks, pt actively engaged, contributing without prompting and growing in his recovery, actively working towards identified goals. Pt is ready and agreeable to discharge at this time, effective 04/12/2021 with plan to follow up with individual OP provider for on-going therapy and treatment needs.   Remaining deficits: See above    Education / Equipment: See above   Patient agrees to discharge. Patient goals were met. Patient is being discharged due to meeting the stated rehab goals..     Plan - 04/12/21 1427     Occupational performance deficits (Please refer to evaluation for details): ADL's;IADL's;Rest and Sleep;Education;Work;Leisure;Social Participation    Body Structure / Function / Physical Skills ADL    Cognitive Skills  Attention;Emotional;Energy/Drive;Learn;Memory;Perception;Problem Solve;Safety Awareness;Temperament/Personality;Thought;Understand    Psychosocial Skills Coping Strategies;Environmental  Adaptations;Habits;Interpersonal Interaction;Routines and Behaviors             Patient will benefit from skilled therapeutic intervention in order to improve the following deficits and impairments:   Body Structure / Function / Physical Skills: ADL Cognitive Skills: Attention, Emotional, Energy/Drive, Learn, Memory, Perception, Problem Solve, Safety Awareness, Temperament/Personality, Thought, Understand Psychosocial Skills: Coping Strategies, Environmental  Adaptations, Habits, Interpersonal Interaction, Routines and Behaviors   Visit Diagnosis: Difficulty coping  Frontal lobe and executive function deficit  Severe episode of recurrent major depressive disorder, without psychotic features ([place])    Problem List There are no problems to display for this patient.   04/12/2021  Francisco Edwards, MOT, OTR/L  04/12/2021, 2:28 PM  [facility] [place], [name], [address] Phone: [phone]   Fax:  [phone]  Name: Francisco Edwards MRN: [id number] Date of Birth: 06-Feb-1986

## 2021-04-23 ENCOUNTER — Other Ambulatory Visit (HOSPITAL_COMMUNITY): Payer: Self-pay | Admitting: Family

## 2021-05-09 NOTE — Psych (Signed)
Virtual Visit via Video Note  I connected with Francisco Edwards on 03/27/21 at  9:00 AM EDT by a video enabled telemedicine application and verified that I am speaking with the correct person using two identifiers.  Location: Patient: patient home Provider: clinical home office   I discussed the limitations of evaluation and management by telemedicine and the availability of in person appointments. The patient expressed understanding and agreed to proceed.   I discussed the assessment and treatment plan with the patient. The patient was provided an opportunity to ask questions and all were answered. The patient agreed with the plan and demonstrated an understanding of the instructions.   The patient was advised to call back or seek an in-person evaluation if the symptoms worsen or if the condition fails to improve as anticipated.  Pt was provided 240 minutes of non-face-to-face time during this encounter.   [name], LCSW    [facility] PHP THERAPIST PROGRESS NOTE  Francisco Edwards [id number]  Session Time: 9:00 - 10:00  Participation Level: Active  Behavioral Response: CasualAlertDepressed  Type of Therapy: Group Therapy  Treatment Goals addressed: Coping  Interventions: CBT, DBT, Supportive, and Reframing  Summary: Clinician led check-in regarding current stressors and situation. Clinician utilized active listening and empathetic response and validated patient emotions. Clinician facilitated processing group on pertinent issues.   Therapist Response: Francisco Edwards is a 35 y.o. male who presents with depression symptoms. Patient arrived within time allowed and reports that he is feeling "okay." Patient rates his mood at a 6 on a scale of 1-10 with 10 being great. Pt reports struggling with grief and tearfulness yesterday. Pt reports sleeping well yesterday for the first time in a few days. Pt states struggle with anxiety about opening up in group. Pt able to process. Pt  engaged in discussion.         Session Time: 10:00 - 11:00   Participation Level: Active   Behavioral Response: CasualAlertDepressed   Type of Therapy: Group Therapy   Treatment Goals addressed: Coping   Interventions: CBT, DBT, Supportive and Reframing   Summary: Cln led discussion on guilt and the way it impacts [place]. Cln utilized CBT cognitive distortion: emotional reasoning to inform discussion. Cln encouraged pt's to consider whether the guilt was founded as a first step to address the feeling. Group members discussed feelings of guilt and worked to determine whether those feelings were founded in truth or feeling.      Therapist Response: Pt engaged in discussion and is able to offer a guilt example and work through it with the group.          Session Time: 11:00 - 12:00   Participation Level: Active   Behavioral Response: CasualAlertDepressed   Type of Therapy: Group Therapy   Treatment Goals addressed: Coping   Interventions: Supportive, Reframing   Summary: Spiritual Care group   Therapist Response: Pt engaged in session. See chaplain note           Session Time: 12:00 -1:00   Participation Level: Active   Behavioral Response: CasualAlertDepressed   Type of Therapy: Group therapy   Treatment Goals addressed: Coping   Interventions: Psychosocial skills training, Supportive   Summary: 12:00 - 12:50: Occupational Therapy group 12:50 -1:00 Clinician led check-out. Clinician assessed for immediate needs, medication compliance and efficacy, and safety concerns   Therapist Response: 12:00 - 12:50: Patient engaged in group. See OT note.  12:50 - 1:00: At check-out, patient rates his mood at  a 9 on a scale of 1-10 with 10 being great. Pt reports afternoon plans of resting at home. Pt demonstrates some progress as evidenced by participating in first group session. Patient denies SI/HI at the end of group.   Suicidal/Homicidal: Nowithout intent/plan  Plan:  Pt will continue in PHP while working to decrease depression symptoms, decrease irritability, and increase ability to manage symptoms in a healthy manner.   Diagnosis: Severe episode of recurrent major depressive disorder, without psychotic features (HCC) [F33.2]    1. Severe episode of recurrent major depressive disorder, without psychotic features (HCC)      [name], LCSW 05/09/2021

## 2021-05-09 NOTE — Psych (Signed)
Virtual Visit via Video Note  I connected with Francisco Edwards on 03/26/21 at  9:00 AM EDT by a video enabled telemedicine application and verified that I am speaking with the correct person using two identifiers.  Location: Patient: patient home Provider: clinical home office   I discussed the limitations of evaluation and management by telemedicine and the availability of in person appointments. The patient expressed understanding and agreed to proceed.   I discussed the assessment and treatment plan with the patient. The patient was provided an opportunity to ask questions and all were answered. The patient agreed with the plan and demonstrated an understanding of the instructions.   The patient was advised to call back or seek an in-person evaluation if the symptoms worsen or if the condition fails to improve as anticipated.  Cln and pt completed treatment plan and pt stated verbal alignment with plan. Pt gave verbal consent to treatment and virtual treatment, agreement with group commitments, and permission to release information for purposes of any requested paperwork.    I provided 10 minutes of non-face-to-face time during this encounter.   [name], LCSW

## 2021-05-16 NOTE — Psych (Signed)
Virtual Visit via Video Note  I connected with Francisco Edwards on 03/28/21 at  9:00 AM EDT by a video enabled telemedicine application and verified that I am speaking with the correct person using two identifiers.  Location: Patient: patient home Provider: clinical home office   I discussed the limitations of evaluation and management by telemedicine and the availability of in person appointments. The patient expressed understanding and agreed to proceed.   I discussed the assessment and treatment plan with the patient. The patient was provided an opportunity to ask questions and all were answered. The patient agreed with the plan and demonstrated an understanding of the instructions.   The patient was advised to call back or seek an in-person evaluation if the symptoms worsen or if the condition fails to improve as anticipated.  Pt was provided 240 minutes of non-face-to-face time during this encounter.   [name], LCSW    [facility] PHP THERAPIST PROGRESS NOTE  Francisco Edwards [id number]  Session Time: 9:00 - 10:00  Participation Level: Active  Behavioral Response: CasualAlertDepressed  Type of Therapy: Group Therapy  Treatment Goals addressed: Coping  Interventions: CBT, DBT, Supportive, and Reframing  Summary: Clinician led check-in regarding current stressors and situation. Clinician utilized active listening and empathetic response and validated patient emotions. Clinician facilitated processing group on pertinent issues.   Therapist Response: Francisco Edwards is a 35 y.o. male who presents with depression symptoms. Patient arrived within time allowed and reports that he is feeling "okay." Patient rates his mood at a 6 on a scale of 1-10 with 10 being great. Pt reports he "didn't do much" yesterday and slept well. Pt reports feeling overwhelmed by group and being around people. Pt reports it is difficult for him to open up. Pt able to process. Pt engaged in discussion.          Session Time: 10:00 - 11:00   Participation Level: Active   Behavioral Response: CasualAlertDepressed   Type of Therapy: Group Therapy   Treatment Goals addressed: Coping   Interventions: CBT, DBT, Supportive and Reframing   Summary: Cln led processing group for pt's current struggles. Group members shared stressors and provided support and feedback. Cln brought in topics of boundaries, healthy relationships, and unhealthy thought processes to inform discussion.      Therapist Response: Pt able to process and provide support to group.           Session Time: 11:00 - 12:00   Participation Level: Active   Behavioral Response: CasualAlertDepressed   Type of Therapy: Group Therapy   Treatment Goals addressed: Coping   Interventions: CBT, DBT, Supportive and Reframing   Summary: Cln led discussion on forgiveness. Group members shared ways in which they struggle with forgiveness and how it has hurt them. Cln provided space for group to process. Cln encouraged pt's to consider forgiveness as a journey to free themselves from something holding them back.      Therapist Response: Pt engaged in discussion and is able to process.             Session Time: 12:00 -1:00   Participation Level: Active   Behavioral Response: CasualAlertDepressed   Type of Therapy: Group therapy   Treatment Goals addressed: Coping   Interventions: Psychosocial skills training, Supportive   Summary: 12:00 - 12:50: Occupational Therapy group 12:50 -1:00 Clinician led check-out. Clinician assessed for immediate needs, medication compliance and efficacy, and safety concerns   Therapist Response: 12:00 - 12:50: Patient engaged  in group. See OT note.  12:50 - 1:00: At check-out, patient rates his mood at a 7 on a scale of 1-10 with 10 being great. Pt reports afternoon plans of playing video games. Pt demonstrates some progress as evidenced by continued willingness. Patient denies SI/HI at  the end of group.   Suicidal/Homicidal: Nowithout intent/plan  Plan: Pt will continue in PHP while working to decrease depression symptoms, decrease irritability, and increase ability to manage symptoms in a healthy manner.   Diagnosis: Severe episode of recurrent major depressive disorder, without psychotic features (HCC) [F33.2]    1. Severe episode of recurrent major depressive disorder, without psychotic features (HCC)      [name], LCSW 05/16/2021

## 2021-05-16 NOTE — Psych (Signed)
Virtual Visit via Video Note  I connected with Francisco Edwards on 03/29/21 at  9:00 AM EDT by a video enabled telemedicine application and verified that I am speaking with the correct person using two identifiers.  Location: Patient: patient home Provider: clinical home office   I discussed the limitations of evaluation and management by telemedicine and the availability of in person appointments. The patient expressed understanding and agreed to proceed.   I discussed the assessment and treatment plan with the patient. The patient was provided an opportunity to ask questions and all were answered. The patient agreed with the plan and demonstrated an understanding of the instructions.   The patient was advised to call back or seek an in-person evaluation if the symptoms worsen or if the condition fails to improve as anticipated.  Pt was provided 180 minutes of non-face-to-face time during this encounter.   [name], LCSW    [facility] PHP THERAPIST PROGRESS NOTE  MONICA ZAHLER [id number]  Session Time: 9:00 - 10:00  Participation Level: Active  Behavioral Response: CasualAlertDepressed  Type of Therapy: Group Therapy  Treatment Goals addressed: Coping  Interventions: CBT, DBT, Supportive, and Reframing  Summary: Clinician led check-in regarding current stressors and situation. Clinician utilized active listening and empathetic response and validated patient emotions. Clinician facilitated processing group on pertinent issues.   Therapist Response: Francisco Edwards is a 35 y.o. male who presents with depression symptoms. Patient arrived within time allowed and reports that he is feeling "tired." Patient rates his mood at a 6 on a scale of 1-10 with 10 being great. Pt reports his mood was low yesterday. Pt states he tried to keep himself busy and did yard work and [employer] transport [employer]. Pt states he overslept due to staying up late with distractions. Pt able to process. Pt engaged in  discussion.         Session Time: 10:00 - 11:00   Participation Level: Active   Behavioral Response: CasualAlertDepressed   Type of Therapy: Group Therapy   Treatment Goals addressed: Coping   Interventions: CBT, DBT, Supportive and Reframing   Summary: Cln led processing group for pt's current struggles. Group members shared stressors and provided support and feedback. Cln brought in topics of boundaries, healthy relationships, and unhealthy thought processes to inform discussion.      Therapist Response: Pt able to process and provide support to group.           Session Time: 11:00 - 12:00   Participation Level: Active   Behavioral Response: CasualAlertDepressed   Type of Therapy: Group Therapy   Treatment Goals addressed: Coping   Interventions: CBT, DBT, Supportive and Reframing   Summary: Cln introduced grounding techniques as a coping strategy. Cln utilized handout "Detaching from emotional pain" from [employer]. Group reviewed grounding strategies and how they can apply them to their every day life and in which situations.      Therapist Response: Pt engaged in discussion and is able to identify ways to utilize the techniques.       **Pt chose to leave approximately 12pm due to feeling ill. Pt denies SI/HI before leaving group.    Francisco Edwards: Nowithout intent/plan  Plan: Pt will continue in PHP while working to decrease depression symptoms, decrease irritability, and increase ability to manage symptoms in a healthy manner.   Diagnosis: Severe episode of recurrent major depressive disorder, without psychotic features (HCC) [F33.2]    1. Severe episode of recurrent major depressive disorder, without  psychotic features [facility])      [name], LCSW 05/16/2021

## 2021-05-17 NOTE — Psych (Signed)
Virtual Visit via Video Note  I connected with Francisco Edwards on 04/08/21 at  9:00 AM EDT by a video enabled telemedicine application and verified that I am speaking with the correct person using two identifiers.  Location: Patient: patient home Provider: clinical home office   I discussed the limitations of evaluation and management by telemedicine and the availability of in person appointments. The patient expressed understanding and agreed to proceed.   I discussed the assessment and treatment plan with the patient. The patient was provided an opportunity to ask questions and all were answered. The patient agreed with the plan and demonstrated an understanding of the instructions.   The patient was advised to call back or seek an in-person evaluation if the symptoms worsen or if the condition fails to improve as anticipated.  Pt was provided 240 minutes of non-face-to-face time during this encounter.   [name], LCSW    [facility] PHP THERAPIST PROGRESS NOTE  Francisco Edwards [id number]  Session Time: 9:00 - 10:00  Participation Level: Active  Behavioral Response: CasualAlertDepressed  Type of Therapy: Group Therapy  Treatment Goals addressed: Coping  Interventions: CBT, DBT, Supportive, and Reframing  Summary: Clinician led check-in regarding current stressors and situation. Clinician utilized active listening and empathetic response and validated patient emotions. Clinician facilitated processing group on pertinent issues.   Therapist Response: Francisco Edwards is a 35 y.o. male who presents with depression symptoms. Patient arrived within time allowed and reports that he is feeling "good." Patient rates his mood at a 9 on a scale of 1-10 with 10 being great. Pt reports his weekend was "okay" and he spent time with his car club. Pt states he found out his aunt is in hospice and that upset him and exacerbated his grief. Pt able to process. Pt engaged in discussion.          Session Time: 10:00 - 11:00   Participation Level: Active   Behavioral Response: CasualAlertDepressed   Type of Therapy: Group Therapy   Treatment Goals addressed: Coping   Interventions: CBT, DBT, Supportive and Reframing   Summary: Cln led processing group for pt's current struggles. Group members shared stressors and provided support and feedback. Cln brought in topics of boundaries, healthy relationships, and unhealthy thought processes to inform discussion.     Therapist Response: Pt able to process and provide support to group.            Session Time: 11:00- 12:00   Participation Level: Active   Behavioral Response: CasualAlertDepressed   Type of Therapy: Group Therapy   Treatment Goals addressed: Coping   Interventions: CBT, DBT, Supportive and Reframing   Summary: Cln led discussion on impulsivity. Group discussed struggles with impulsivity. Cln highlighted theme of immediacy. Group built insight around the way immediacy interacts with impulsivity. Cln encouraged pt's to consider mantras and grounding statements to remind themselves there is time to think/feel/act.      Therapist Response: Pt engaged in discussion and is able to make connections and gain insight.          Session Time: 12:00 -1:00   Participation Level: Active   Behavioral Response: CasualAlertDepressed   Type of Therapy: Group therapy   Treatment Goals addressed: Coping   Interventions: CBT; Solution focused; Supportive; Reframing   Summary: 12:00 - 12:50: Cln continued topic of boundaries and led a "boundary workshop" in which group members brought current boundary issues and group worked together to apply boundary concepts to help address  the concern. Cln helped shape conversation to maintain fidelity.  12:50 -1:00 Clinician led check-out. Clinician assessed for immediate needs, medication compliance and efficacy, and safety concerns   Therapist Response: 12:50 - 1:00:  Pt  engaged in discussion and shared a current boundary issue and reports gaining insight.  12:50 - 1:00: At check-out, patient rates his mood at a 10 on a scale of 1-10 with 10 being great. Pt reports afternoon plans of doing his hair. Pt demonstrates some progress as evidenced by increased willingness to be around people. Patient denies SI/HI at the end of group.   Suicidal/Homicidal: Nowithout intent/plan  Plan: Pt will continue in PHP while working to decrease depression symptoms, decrease irritability, and increase ability to manage symptoms in a healthy manner.   Diagnosis: Severe episode of recurrent major depressive disorder, without psychotic features (HCC) [F33.2]    1. Severe episode of recurrent major depressive disorder, without psychotic features (HCC)      [name], LCSW 05/17/2021

## 2021-05-17 NOTE — Psych (Signed)
Virtual Visit via Video Note  I connected with Francisco Edwards on 04/02/21 at  9:00 AM EDT by a video enabled telemedicine application and verified that I am speaking with the correct person using two identifiers.  Location: Patient: patient home Provider: clinical home office   I discussed the limitations of evaluation and management by telemedicine and the availability of in person appointments. The patient expressed understanding and agreed to proceed.   I discussed the assessment and treatment plan with the patient. The patient was provided an opportunity to ask questions and all were answered. The patient agreed with the plan and demonstrated an understanding of the instructions.   The patient was advised to call back or seek an in-person evaluation if the symptoms worsen or if the condition fails to improve as anticipated.  Pt was provided 240 minutes of non-face-to-face time during this encounter.   [name], LCSW    [facility] PHP THERAPIST PROGRESS NOTE  [name] [id number]  Session Time: 9:00 - 10:00  Participation Level: Active  Behavioral Response: CasualAlertDepressed  Type of Therapy: Group Therapy  Treatment Goals addressed: Coping  Interventions: CBT, DBT, Supportive, and Reframing  Summary: Clinician led check-in regarding current stressors and situation. Clinician utilized active listening and empathetic response and validated patient emotions. Clinician facilitated processing group on pertinent issues.   Therapist Response: [name] is a 35 y.o. male who presents with depression symptoms. Patient arrived within time allowed and reports that he is feeling "good." Patient rates his mood at a 9 on a scale of 1-10 with 10 being great. Pt reports reaching out yesterday to a friend and feeling better after talking. Pt states he showered yesterday for the first time in 2 weeks. Pt reports struggles with spending money. Pt able to process. Pt engaged  in discussion.         Session Time: 10:00 - 11:00   Participation Level: Active   Behavioral Response: CasualAlertDepressed   Type of Therapy: Group Therapy   Treatment Goals addressed: Coping   Interventions: CBT, DBT, Supportive and Reframing   Summary: Cln led processing group for pt's current struggles. Group members shared stressors and provided support and feedback. Cln brought in topics of boundaries, healthy relationships, and unhealthy thought processes to inform discussion.      Therapist Response: Pt able to process and provide support to group.           Session Time: 11:00 - 12:00   Participation Level: Active   Behavioral Response: CasualAlertDepressed   Type of Therapy: Group Therapy   Treatment Goals addressed: Coping   Interventions: CBT, DBT, Supportive and Reframing   Summary: Cln led discussion on communicating our struggles with our support team. Cln introduced the concept of giving disclosures to the people close to Francisco Edwards regarding the way we act in specific situations or the way we process certain stimuli. Group members discussed ways to provide this "road map to our brain" and in what situations this may be helpful to them      Therapist Response: Pt engaged in discussion and is able to determine situations in which providing a disclosure can be helpful and practiced doing so.            Session Time: 12:00 -1:00   Participation Level: Active   Behavioral Response: CasualAlertDepressed   Type of Therapy: Group therapy   Treatment Goals addressed: Coping   Interventions: Psychosocial skills training, Supportive   Summary: 12:00 - 12:50:  Occupational Therapy group 12:50 -1:00 Clinician led check-out. Clinician assessed for immediate needs, medication compliance and efficacy, and safety concerns   Therapist Response: 12:00 - 12:50: Patient engaged in group. See OT note.  12:50 - 1:00: At check-out, patient rates his mood at a 10 on a  scale of 1-10 with 10 being great. Pt reports afternoon plans of "chilling out." Pt demonstrates some progress as evidenced by engaging in hygiene practicies. Patient denies SI/HI at the end of group.   Suicidal/Homicidal: Nowithout intent/plan  Plan: Pt will continue in PHP while working to decrease depression symptoms, decrease irritability, and increase ability to manage symptoms in a healthy manner.   Diagnosis: Severe episode of recurrent major depressive disorder, without psychotic features (HCC) [F33.2]    1. Severe episode of recurrent major depressive disorder, without psychotic features (HCC)      [name], LCSW 05/17/2021

## 2021-05-17 NOTE — Psych (Signed)
Virtual Visit via Video Note  I connected with Francisco Edwards on 04/01/21 at  9:00 AM EDT by a video enabled telemedicine application and verified that I am speaking with the correct person using two identifiers.  Location: Patient: patient home Provider: clinical home office   I discussed the limitations of evaluation and management by telemedicine and the availability of in person appointments. The patient expressed understanding and agreed to proceed.   I discussed the assessment and treatment plan with the patient. The patient was provided an opportunity to ask questions and all were answered. The patient agreed with the plan and demonstrated an understanding of the instructions.   The patient was advised to call back or seek an in-person evaluation if the symptoms worsen or if the condition fails to improve as anticipated.  Pt was provided 240 minutes of non-face-to-face time during this encounter.   [name], LCSW    [facility] PHP THERAPIST PROGRESS NOTE  Francisco Edwards [id number]  Session Time: 9:00 - 10:00  Participation Level: Active  Behavioral Response: CasualAlertDepressed  Type of Therapy: Group Therapy  Treatment Goals addressed: Coping  Interventions: CBT, DBT, Supportive, and Reframing  Summary: Clinician led check-in regarding current stressors and situation. Clinician utilized active listening and empathetic response and validated patient emotions. Clinician facilitated processing group on pertinent issues.   Therapist Response: Francisco Edwards is a 35 y.o. male who presents with depression symptoms. Patient arrived within time allowed and reports that he is feeling "good." Patient rates his mood at a 8 on a scale of 1-10 with 10 being great. Pt reports he worked on cars and computers over the weekend to stay busy. Pt states struggling with anger and trying to manage with distraction. Pt reports continued preoccupation with his mother's death. Pt able to  process. Pt engaged in discussion.         Session Time: 10:00 - 11:00   Participation Level: Active   Behavioral Response: CasualAlertDepressed   Type of Therapy: Group Therapy   Treatment Goals addressed: Coping   Interventions: CBT, DBT, Supportive and Reframing   Summary: Cln led processing group for pt's current struggles. Group members shared stressors and provided support and feedback. Cln brought in topics of boundaries, healthy relationships, and unhealthy thought processes to inform discussion.      Therapist Response: Pt able to process and provide support to group.           Session Time: 11:00 - 12:00   Participation Level: Active   Behavioral Response: CasualAlertDepressed   Type of Therapy: Group Therapy   Treatment Goals addressed: Coping   Interventions: CBT, DBT, Supportive and Reframing   Summary: Cln led discussion on setting boundaries as a way to increase self-care. Group members discussed things that are stumbling blocks to them engaging in self-care and worked to determine what boundary could address that stumbling block. Group worked together to determine how to address the boundary. Cln brought in topics of boundaries, assertiveness, thought challenging, and self-care.      Therapist Response: Pt engaged in discussion and identifies a boundary that can improve their self-care.            Session Time: 12:00 -1:00   Participation Level: Active   Behavioral Response: CasualAlertDepressed   Type of Therapy: Group therapy   Treatment Goals addressed: Coping   Interventions: Psychosocial skills training, Supportive   Summary: 12:00 - 12:50: Occupational Therapy group 12:50 -1:00 Clinician led check-out. Clinician assessed for  immediate needs, medication compliance and efficacy, and safety concerns   Therapist Response: 12:00 - 12:50: Patient engaged in group. See OT note.  12:50 - 1:00: At check-out, patient rates his mood at a 8 on a  scale of 1-10 with 10 being great. Pt reports afternoon plans of picking up prescriptions. Pt demonstrates some progress as evidenced by utilizing skills. Patient denies SI/HI at the end of group.   Suicidal/Homicidal: Nowithout intent/plan  Plan: Pt will continue in PHP while working to decrease depression symptoms, decrease irritability, and increase ability to manage symptoms in a healthy manner.   Diagnosis: Severe episode of recurrent major depressive disorder, without psychotic features (HCC) [F33.2]    1. Severe episode of recurrent major depressive disorder, without psychotic features (HCC)      [name], LCSW 05/17/2021

## 2021-05-17 NOTE — Psych (Signed)
Virtual Visit via Video Note  I connected with Francisco Edwards on 04/10/21 at  9:00 AM EDT by a video enabled telemedicine application and verified that I am speaking with the correct person using two identifiers.  Location: Patient: patient home Provider: clinical home office   I discussed the limitations of evaluation and management by telemedicine and the availability of in person appointments. The patient expressed understanding and agreed to proceed.   I discussed the assessment and treatment plan with the patient. The patient was provided an opportunity to ask questions and all were answered. The patient agreed with the plan and demonstrated an understanding of the instructions.   The patient was advised to call back or seek an in-person evaluation if the symptoms worsen or if the condition fails to improve as anticipated.  Pt was provided 240 minutes of non-face-to-face time during this encounter.   [name], LCSW    [facility] PHP THERAPIST PROGRESS NOTE  DEMARKO ZEIMET [id number]  Session Time: 9:00 - 10:00  Participation Level: Active  Behavioral Response: CasualAlertDepressed  Type of Therapy: Group Therapy  Treatment Goals addressed: Coping  Interventions: CBT, DBT, Supportive, and Reframing  Summary: Clinician led check-in regarding current stressors and situation. Clinician utilized active listening and empathetic response and validated patient emotions. Clinician facilitated processing group on pertinent issues.   Therapist Response: Francisco Edwards is a 35 y.o. male who presents with depression symptoms. Patient arrived within time allowed and reports that he is feeling "good." Patient rates his mood at a 10 on a scale of 1-10 with 10 being great. Pt reports he is trying to form healthier habits and is 2 days cigarette free and did his last [employer] yesterday. Pt able to process. Pt engaged in discussion.         Session Time: 10:00 - 11:00    Participation Level: Active   Behavioral Response: CasualAlertDepressed   Type of Therapy: Group Therapy   Treatment Goals addressed: Coping   Interventions: CBT, DBT, Supportive and Reframing   Summary: Cln led processing group for pt's current struggles. Group members shared stressors and provided support and feedback. Cln brought in topics of boundaries, healthy relationships, and unhealthy thought processes to inform discussion.     Therapist Response: Pt able to process and provide support to group.            Session Time: 11:00- 12:00   Participation Level: Active   Behavioral Response: CasualAlertDepressed   Type of Therapy: Group Therapy   Treatment Goals addressed: Coping   Interventions: Supportive, Reframing   Summary: Spiritual Care group   Therapist Response: Pt engaged in session. See chaplain note           Session Time: 12:00 -1:00   Participation Level: Active   Behavioral Response: CasualAlertDepressed   Type of Therapy: Group therapy   Treatment Goals addressed: Coping   Interventions: Psychosocial skills training, Supportive   Summary: 12:00 - 12:50: Occupational Therapy group 12:50 -1:00 Clinician led check-out. Clinician assessed for immediate needs, medication compliance and efficacy, and safety concerns   Therapist Response: 12:00 - 12:50: Patient engaged in group. See OT note. 12:50 - 1:00: At check-out, patient rates his mood at a 12 on a scale of 1-10 with 10 being great. Pt reports afternoon plans of working on his car. Pt demonstrates some progress as evidenced by increased sharing and openness. Patient denies SI/HI at the end of group.   Suicidal/Homicidal: Nowithout intent/plan  Plan:  Pt will continue in PHP while working to decrease depression symptoms, decrease irritability, and increase ability to manage symptoms in a healthy manner.   Diagnosis: Severe episode of recurrent major depressive disorder, without psychotic  features (HCC) [F33.2]    1. Severe episode of recurrent major depressive disorder, without psychotic features (HCC)      [name], LCSW 05/17/2021

## 2021-05-17 NOTE — Psych (Signed)
Virtual Visit via Video Note  I connected with Francisco Edwards on 04/04/21 at  9:00 AM EDT by a video enabled telemedicine application and verified that I am speaking with the correct person using two identifiers.  Location: Patient: patient home Provider: clinical home office   I discussed the limitations of evaluation and management by telemedicine and the availability of in person appointments. The patient expressed understanding and agreed to proceed.   I discussed the assessment and treatment plan with the patient. The patient was provided an opportunity to ask questions and all were answered. The patient agreed with the plan and demonstrated an understanding of the instructions.   The patient was advised to call back or seek an in-person evaluation if the symptoms worsen or if the condition fails to improve as anticipated.  Pt was provided 240 minutes of non-face-to-face time during this encounter.   [name], LCSW    [facility] PHP THERAPIST PROGRESS NOTE  Francisco Edwards [id number]  Session Time: 9:00 - 10:00  Participation Level: Active  Behavioral Response: CasualAlertDepressed  Type of Therapy: Group Therapy  Treatment Goals addressed: Coping  Interventions: CBT, DBT, Supportive, and Reframing  Summary: Clinician led check-in regarding current stressors and situation. Clinician utilized active listening and empathetic response and validated patient emotions. Clinician facilitated processing group on pertinent issues.   Therapist Response: Francisco Edwards is a 35 y.o. male who presents with depression symptoms. Patient arrived within time allowed and reports that he is feeling "good." Patient rates his mood at a 10 on a scale of 1-10 with 10 being great. Pt reports he went shopping and was able to help a stranger which made him feel good. Pt reports his mom's urn arrived and it was emotional for him. Pt reports feeling he was able to be more productive with his  grief. Pt able to process. Pt engaged in discussion.         Session Time: 10:00 - 11:00   Participation Level: Active   Behavioral Response: CasualAlertDepressed   Type of Therapy: Group Therapy   Treatment Goals addressed: Coping   Interventions: CBT, DBT, Supportive and Reframing   Summary: Cln led discussion on healthy relationships. Group members shared issues they have experienced in past relationships and in identifying what "healthy" looks like. Cln discussed respect, trust, and honesty as non-negotiable traits in a healthy dynamic. Group shared and problem solved barriers to recognizing and priortizing these traits.      Therapist Response: Pt engaged in discussion and is able to process.           Session Time: 11:00- 12:00   Participation Level: Active   Behavioral Response: CasualAlertDepressed   Type of Therapy: Group Therapy   Treatment Goals addressed: Coping   Interventions: CBT, DBT, Supportive and Reframing   Summary: Cln introduced topic of CBT cognitive distortions. Cln discussed unhealthy thought patterns and how our thoughts shape our reality and irrational thoughts can alter our perspective. Cln utilized handout "cognitive distortions" to discuss common examples of distorted thoughts and group members worked to identify examples in their own life.      Therapist Response: Pt engaged in discussion and is able to determine examples of distorted thinking in their own life.         Session Time: 12:00 -1:00   Participation Level: Active   Behavioral Response: CasualAlertDepressed   Type of Therapy: Group therapy   Treatment Goals addressed: Coping   Interventions: CBT; Solution focused; Supportive;  Reframing   Summary: 12:00 - 12:50: Cln continued topic of CBT cognitive distortions and utilized handout "Unhealthy Thought Patterns"to review common examples of distorted thought to increase awareness of the distorted thoughts. 12:50 -1:00  Clinician led check-out. Clinician assessed for immediate needs, medication compliance and efficacy, and safety concerns   Therapist Response: 12:50 - 1:00: Pt engaged in discussion and is able to make connections.  12:50 - 1:00: At check-out, patient rates his mood at a 10 on a scale of 1-10 with 10 being great. Pt reports afternoon plans of doing yard work. Pt demonstrates some progress as evidenced by increased acceptance of feelings. Patient denies SI/HI at the end of group.   Suicidal/Homicidal: Nowithout intent/plan  Plan: Pt will continue in PHP while working to decrease depression symptoms, decrease irritability, and increase ability to manage symptoms in a healthy manner.   Diagnosis: Severe episode of recurrent major depressive disorder, without psychotic features (HCC) [F33.2]    1. Severe episode of recurrent major depressive disorder, without psychotic features (HCC)      [name], LCSW 05/17/2021

## 2021-05-17 NOTE — Psych (Signed)
Virtual Visit via Video Note  I connected with Francisco Edwards on 04/05/21 at  9:00 AM EDT by a video enabled telemedicine application and verified that I am speaking with the correct person using two identifiers.  Location: Patient: patient home Provider: clinical home office   I discussed the limitations of evaluation and management by telemedicine and the availability of in person appointments. The patient expressed understanding and agreed to proceed.   I discussed the assessment and treatment plan with the patient. The patient was provided an opportunity to ask questions and all were answered. The patient agreed with the plan and demonstrated an understanding of the instructions.   The patient was advised to call back or seek an in-person evaluation if the symptoms worsen or if the condition fails to improve as anticipated.  Pt was provided 90 minutes of non-face-to-face time during this encounter.   [name], LCSW    [facility] PHP THERAPIST PROGRESS NOTE  RONEN BROMWELL [id number]  Session Time: 9:00 - 10:00  Participation Level: Active  Behavioral Response: CasualAlertDepressed  Type of Therapy: Group Therapy  Treatment Goals addressed: Coping  Interventions: CBT, DBT, Supportive, and Reframing  Summary: Clinician led check-in regarding current stressors and situation. Clinician utilized active listening and empathetic response and validated patient emotions. Clinician facilitated processing group on pertinent issues.   Therapist Response: ELMA LIMAS is a 35 y.o. male who presents with depression symptoms. Patient arrived within time allowed and reports that he is feeling "amazing." Patient rates his mood at a 10 on a scale of 1-10 with 10 being great. Pt reports he feels his medicine is at efficacy and working. Pt states waking up in a good mood. Pt struggles with mood dependent thinking. Pt able to process. Pt engaged in discussion.         Session Time:  10:00 - 11:00   Participation Level: Active   Behavioral Response: CasualAlertDepressed   Type of Therapy: Group Therapy   Treatment Goals addressed: Coping   Interventions: CBT, DBT, Supportive and Reframing   Summary: Cln continued discussion on topic of boundaries. Group reviewed previous aspects of boundaries discussed. Cln utilized handout "Tips for [employer]" and group members discussed how to apply the tips. Cln shaped conversation and cued for healthy boundary characteristics.      Therapist Response: Pt engaged in discussion and is able to process ways to increase their healthy boundaries.     **Pt chose to leave group at 10:30 due to a work issue.    Suicidal/Homicidal: Nowithout intent/plan  Plan: Pt will continue in PHP while working to decrease depression symptoms, decrease irritability, and increase ability to manage symptoms in a healthy manner.   Diagnosis: Severe episode of recurrent major depressive disorder, without psychotic features (HCC) [F33.2]    1. Severe episode of recurrent major depressive disorder, without psychotic features (HCC)      [name], LCSW 05/17/2021

## 2021-05-17 NOTE — Psych (Signed)
Virtual Visit via Video Note  I connected with Francisco Edwards on 04/03/21 at  9:00 AM EDT by a video enabled telemedicine application and verified that I am speaking with the correct person using two identifiers.  Location: Patient: patient home Provider: clinical home office   I discussed the limitations of evaluation and management by telemedicine and the availability of in person appointments. The patient expressed understanding and agreed to proceed.   I discussed the assessment and treatment plan with the patient. The patient was provided an opportunity to ask questions and all were answered. The patient agreed with the plan and demonstrated an understanding of the instructions.   The patient was advised to call back or seek an in-person evaluation if the symptoms worsen or if the condition fails to improve as anticipated.  Pt was provided 240 minutes of non-face-to-face time during this encounter.   [name], LCSW    [facility] PHP THERAPIST PROGRESS NOTE  Francisco Edwards [id number]  Session Time: 9:00 - 10:00  Participation Level: Active  Behavioral Response: CasualAlertDepressed  Type of Therapy: Group Therapy  Treatment Goals addressed: Coping  Interventions: CBT, DBT, Supportive, and Reframing  Summary: Clinician led check-in regarding current stressors and situation. Clinician utilized active listening and empathetic response and validated patient emotions. Clinician facilitated processing group on pertinent issues.   Therapist Response: Francisco Edwards is a 35 y.o. male who presents with depression symptoms. Patient arrived within time allowed and reports that he is feeling "good." Patient rates his mood at a 8 on a scale of 1-10 with 10 being great. Pt reports he slept well. Pt state struggles with filling his time and not spiraling when he is idle. Pt able to process. Pt engaged in discussion.         Session Time: 10:00 - 11:00   Participation Level:  Active   Behavioral Response: CasualAlertDepressed   Type of Therapy: Group Therapy   Treatment Goals addressed: Coping   Interventions: CBT, DBT, Supportive and Reframing   Summary: Cln led processing group for pt's current struggles. Group members shared stressors and provided support and feedback. Cln brought in topics of boundaries, healthy relationships, and unhealthy thought processes to inform discussion.      Therapist Response: Pt able to process and provide support to group.           Session Time: 11:00 - 12:00   Participation Level: Active   Behavioral Response: CasualAlertDepressed   Type of Therapy: Group Therapy   Treatment Goals addressed: Coping   Interventions: CBT, DBT, Supportive and Reframing   Summary: Nutrition group with dietician K. Watts. Group discussed how nutrition can support our mental health.      Therapist Response: Pt engaged in discussion.           Session Time: 12:00 -1:00   Participation Level: Active   Behavioral Response: CasualAlertDepressed   Type of Therapy: Group therapy   Treatment Goals addressed: Coping   Interventions: Psychosocial skills training, Supportive   Summary: 12:00 - 12:50: Occupational Therapy group 12:50 -1:00 Clinician led check-out. Clinician assessed for immediate needs, medication compliance and efficacy, and safety concerns   Therapist Response: 12:00 - 12:50: Patient engaged in group. See OT note.  12:50 - 1:00: At check-out, patient rates his mood at a 10 on a scale of 1-10 with 10 being great. Pt reports afternoon plans of getting tested for COVID. Pt demonstrates some progress as evidenced by increased care for himself. Patient  denies SI/HI at the end of group.   Suicidal/Homicidal: Nowithout intent/plan  Plan: Pt will continue in PHP while working to decrease depression symptoms, decrease irritability, and increase ability to manage symptoms in a healthy manner.   Diagnosis: Severe  episode of recurrent major depressive disorder, without psychotic features (HCC) [F33.2]    1. Severe episode of recurrent major depressive disorder, without psychotic features (HCC)      [name], LCSW 05/17/2021

## 2021-05-20 NOTE — Psych (Signed)
Virtual Visit via Video Note  I connected with Francisco Edwards on 04/11/21 at  9:00 AM EDT by a video enabled telemedicine application and verified that I am speaking with the correct person using two identifiers.  Location: Patient: patient home Provider: clinical home office   I discussed the limitations of evaluation and management by telemedicine and the availability of in person appointments. The patient expressed understanding and agreed to proceed.   I discussed the assessment and treatment plan with the patient. The patient was provided an opportunity to ask questions and all were answered. The patient agreed with the plan and demonstrated an understanding of the instructions.   The patient was advised to call back or seek an in-person evaluation if the symptoms worsen or if the condition fails to improve as anticipated.  Pt was provided 240 minutes of non-face-to-face time during this encounter.   [name], LCSW    [facility] PHP THERAPIST PROGRESS NOTE  Francisco Edwards [id number]  Session Time: 9:00 - 10:00  Participation Level: Active  Behavioral Response: CasualAlertDepressed  Type of Therapy: Group Therapy  Treatment Goals addressed: Coping  Interventions: CBT, DBT, Supportive, and Reframing  Summary: Clinician led check-in regarding current stressors and situation. Clinician utilized active listening and empathetic response and validated patient emotions. Clinician facilitated processing group on pertinent issues.   Therapist Response: Francisco Edwards is a 35 y.o. male who presents with depression symptoms. Patient arrived within time allowed and reports that he is feeling "pretty good." Patient rates his mood at a 9 on a scale of 1-10 with 10 being great. Pt reports he was stressed out yesterday. Pt states he feels his sadness and worry are more manageable, and his irritability and anger are still more difficult to manage. Pt reports working on cleaning his  mom's house and crying less while doing so. Pt able to process. Pt engaged in discussion.         Session Time: 10:00 - 11:00   Participation Level: Active   Behavioral Response: CasualAlertDepressed   Type of Therapy: Group Therapy   Treatment Goals addressed: Coping   Interventions: CBT, DBT, Supportive and Reframing   Summary: Cln led discussion on emotional eating. Cln framed eating as a coping skill and group worked to determine what feelings or events trigger them to use food to cope. Group members discussed why food may be an easier coping skill than others. Cln suggested substituting eating for a less problematic distraction skill.      Therapist Response: Pt engaged in discussion.            Session Time: 11:00- 12:00   Participation Level: Active   Behavioral Response: CasualAlertDepressed   Type of Therapy: Group Therapy   Treatment Goals addressed: Coping   Interventions: Psychosocial skills training, Supportive   Summary: Occupation Therapy group     Therapist Response: Pt engaged in group. See OT note.          Session Time: 12:00 -1:00   Participation Level: Active   Behavioral Response: CasualAlertDepressed   Type of Therapy: Group therapy   Treatment Goals addressed: Coping   Interventions: CBT; Solution focused; Supportive; Reframing   Summary: 12:00 - 12:50: Cln continued discussion on rest and introduced the nine types of rest: time away, permission to not be helpful, something unproductive, connection to art and nature, solitude to recharge, break from responsibility, stillness to decompress, safe space, alone time at home. Group shared ways in which they can  utilize each type of rest and which ones are most problematic for them.  12:50 -1:00 Clinician led check-out. Clinician assessed for immediate needs, medication compliance and efficacy, and safety concerns   Therapist Response: 12:50 - 1:00:  Pt engaged in discussion and shares  which form of rest is most problematic for them.  12:50 - 1:00: At check-out, patient rates his mood at a 10 on a scale of 1-10 with 10 being great. Pt reports afternoon plans of cleaning his car. Pt demonstrates some progress as evidenced by iincreased management of feelings. Patient denies SI/HI at the end of group.    Suicidal/Homicidal: Nowithout intent/plan  Plan: Pt will continue in PHP while working to decrease depression symptoms, decrease irritability, and increase ability to manage symptoms in a healthy manner.   Diagnosis: Severe episode of recurrent major depressive disorder, without psychotic features (HCC) [F33.2]    1. Severe episode of recurrent major depressive disorder, without psychotic features (HCC)      [name], LCSW 05/20/2021

## 2021-05-20 NOTE — Psych (Signed)
Virtual Visit via Video Note  I connected with Francisco Edwards on 04/12/21 at  9:00 AM EDT by a video enabled telemedicine application and verified that I am speaking with the correct person using two identifiers.  Location: Patient: patient home Provider: clinical home office   I discussed the limitations of evaluation and management by telemedicine and the availability of in person appointments. The patient expressed understanding and agreed to proceed.   I discussed the assessment and treatment plan with the patient. The patient was provided an opportunity to ask questions and all were answered. The patient agreed with the plan and demonstrated an understanding of the instructions.   The patient was advised to call back or seek an in-person evaluation if the symptoms worsen or if the condition fails to improve as anticipated.  Pt was provided 240 minutes of non-face-to-face time during this encounter.   [name], LCSW    [facility] PHP THERAPIST PROGRESS NOTE  Francisco Edwards [id number]  Session Time: 9:00 - 10:00  Participation Level: Active  Behavioral Response: CasualAlertDepressed  Type of Therapy: Group Therapy  Treatment Goals addressed: Coping  Interventions: CBT, DBT, Supportive, and Reframing  Summary: Clinician led check-in regarding current stressors and situation. Clinician utilized active listening and empathetic response and validated patient emotions. Clinician facilitated processing group on pertinent issues.   Therapist Response: Francisco Edwards is a 35 y.o. male who presents with depression symptoms. Patient arrived within time allowed and reports that he is feeling "great." Patient rates his mood at a 10 on a scale of 1-10 with 10 being great. Pt reports he "woke up on the right side of the bed." Pt state doing yard work yesterday and enjoying being outside. Pt states he is getting out of the house more and feeling less angry. Pt able to process. Pt  engaged in discussion.         Session Time: 10:00 - 11:00   Participation Level: Active   Behavioral Response: CasualAlertDepressed   Type of Therapy: Group Therapy   Treatment Goals addressed: Coping   Interventions: CBT, DBT, Supportive and Reframing   Summary: Cln led discussion on accountability and the balance between taking responsibility and not beating ourselves up. Group members shared current consequences they are dealing with and how they are processing them. Cln encouraged pt's to utilize the Best Friend Test to make it easier to offer themselves kindness.       Therapist Response: Pt engaged in discussion and is able to process.           Session Time: 11:00- 12:00   Participation Level: Active   Behavioral Response: CasualAlertDepressed   Type of Therapy: Group Therapy, OT   Treatment Goals addressed: Coping   Interventions: Psychosocial skills training, Supportive   Summary: Occupational Therapy group   Therapist Response: Patient engaged in group. See OT note.           Session Time: 12:00 -1:00   Participation Level: Active   Behavioral Response: CasualAlertDepressed   Type of Therapy: Group therapy   Treatment Goals addressed: Coping   Interventions: CBT; Solution focused; Supportive; Reframing   Summary: 12:00 - 12:50: Cln led discussion on positive self-esteem. Group shared struggles they experience with self-esteem. Group highlights difficulty in accepting compliments. Cln encouraged pt's to use checking the facts as a way to challenge the negative thinking which accompanies receving compliments. Cln suggested pt's consider making a compliment file in which they document positive things people say  to/about them to review when they are feeling poorly about themselves. 12:50 -1:00 Clinician led check-out. Clinician assessed for immediate needs, medication compliance and efficacy, and safety concerns   Therapist Response: 12:50 - 1:00: Pt  engaged in conversation and shares difficulty in accepting compliments. Pt is able to process and reports being open to starting a compliment file.  12:50 - 1:00: At check-out, patient rates his mood at a 10 on a scale of 1-10 with 10 being great. Pt reports afternoon plans of mowing his dad's yard. Pt demonstrates some progress as evidenced by improved mood. Patient denies SI/HI at the end of group.    Suicidal/Homicidal: Nowithout intent/plan  Plan: Pt will discharge from PHP due to meeting treatment goals of decreased depression symptoms, decreased irritability, and increased ability to manage symptoms in a healthy manner. Pt has declined IOP and will follow up with his PCP for medication and engage in therapy with his EAP, first appt on 8/29 at 10am. Pt and provider are aligned with discharge. Pt denies SI/HI at time of discharge.   Diagnosis: Severe episode of recurrent major depressive disorder, without psychotic features (HCC) [F33.2]    1. Severe episode of recurrent major depressive disorder, without psychotic features (HCC)      [name], LCSW 05/20/2021

## 2023-09-01 ENCOUNTER — Ambulatory Visit: Payer: Medicaid Other | Admitting: Gastroenterology
